# Patient Record
Sex: Female | Born: 1980 | Race: Black or African American | Hispanic: No | Marital: Single | Smoking: Never smoker
Health system: Southern US, Community
[De-identification: ages and names within clinical notes are randomized; demographics above are authoritative.]

## PROBLEM LIST (undated history)

## (undated) DIAGNOSIS — D649 Anemia, unspecified: Secondary | ICD-10-CM

## (undated) DIAGNOSIS — D573 Sickle-cell trait: Secondary | ICD-10-CM

## (undated) HISTORY — PX: NO PAST SURGERIES: SHX2092

## (undated) HISTORY — DX: Anemia, unspecified: D64.9

---

## 2004-04-13 ENCOUNTER — Ambulatory Visit: Payer: Self-pay | Admitting: Family Medicine

## 2004-06-10 ENCOUNTER — Ambulatory Visit: Payer: Self-pay | Admitting: Family Medicine

## 2004-06-10 ENCOUNTER — Ambulatory Visit: Payer: Self-pay | Admitting: *Deleted

## 2004-06-17 ENCOUNTER — Emergency Department (HOSPITAL_COMMUNITY): Admission: EM | Admit: 2004-06-17 | Discharge: 2004-06-18 | Payer: Self-pay | Admitting: Emergency Medicine

## 2004-07-12 ENCOUNTER — Ambulatory Visit: Payer: Self-pay | Admitting: Family Medicine

## 2004-09-14 ENCOUNTER — Ambulatory Visit: Payer: Self-pay | Admitting: Internal Medicine

## 2004-09-15 ENCOUNTER — Inpatient Hospital Stay (HOSPITAL_COMMUNITY): Admission: AD | Admit: 2004-09-15 | Discharge: 2004-09-15 | Payer: Self-pay | Admitting: Obstetrics & Gynecology

## 2004-09-18 ENCOUNTER — Inpatient Hospital Stay (HOSPITAL_COMMUNITY): Admission: AD | Admit: 2004-09-18 | Discharge: 2004-09-18 | Payer: Self-pay | Admitting: Obstetrics and Gynecology

## 2004-09-21 ENCOUNTER — Inpatient Hospital Stay (HOSPITAL_COMMUNITY): Admission: AD | Admit: 2004-09-21 | Discharge: 2004-09-21 | Payer: Self-pay | Admitting: Obstetrics and Gynecology

## 2004-09-28 ENCOUNTER — Inpatient Hospital Stay (HOSPITAL_COMMUNITY): Admission: AD | Admit: 2004-09-28 | Discharge: 2004-09-28 | Payer: Self-pay | Admitting: Obstetrics and Gynecology

## 2004-10-12 ENCOUNTER — Inpatient Hospital Stay (HOSPITAL_COMMUNITY): Admission: AD | Admit: 2004-10-12 | Discharge: 2004-10-12 | Payer: Self-pay | Admitting: Obstetrics and Gynecology

## 2005-08-31 ENCOUNTER — Ambulatory Visit (HOSPITAL_COMMUNITY): Admission: RE | Admit: 2005-08-31 | Discharge: 2005-08-31 | Payer: Self-pay | Admitting: Obstetrics and Gynecology

## 2005-11-02 ENCOUNTER — Ambulatory Visit: Payer: Self-pay | Admitting: Gynecology

## 2005-11-08 ENCOUNTER — Encounter: Admission: RE | Admit: 2005-11-08 | Discharge: 2005-11-08 | Payer: Self-pay | Admitting: Family Medicine

## 2005-11-16 ENCOUNTER — Ambulatory Visit: Payer: Self-pay | Admitting: Family Medicine

## 2005-11-16 ENCOUNTER — Ambulatory Visit (HOSPITAL_COMMUNITY): Admission: RE | Admit: 2005-11-16 | Discharge: 2005-11-16 | Payer: Self-pay | Admitting: Obstetrics and Gynecology

## 2005-11-20 ENCOUNTER — Ambulatory Visit: Payer: Self-pay | Admitting: Family Medicine

## 2005-11-27 ENCOUNTER — Ambulatory Visit: Payer: Self-pay | Admitting: Obstetrics & Gynecology

## 2005-12-11 ENCOUNTER — Ambulatory Visit: Payer: Self-pay | Admitting: Obstetrics & Gynecology

## 2005-12-28 ENCOUNTER — Ambulatory Visit: Payer: Self-pay | Admitting: *Deleted

## 2006-01-03 ENCOUNTER — Ambulatory Visit: Payer: Self-pay | Admitting: Obstetrics and Gynecology

## 2006-01-03 ENCOUNTER — Inpatient Hospital Stay (HOSPITAL_COMMUNITY): Admission: AD | Admit: 2006-01-03 | Discharge: 2006-01-05 | Payer: Self-pay | Admitting: Gynecology

## 2008-07-09 ENCOUNTER — Ambulatory Visit (HOSPITAL_COMMUNITY): Admission: RE | Admit: 2008-07-09 | Discharge: 2008-07-09 | Payer: Self-pay | Admitting: Family Medicine

## 2008-10-20 ENCOUNTER — Ambulatory Visit (HOSPITAL_COMMUNITY): Admission: RE | Admit: 2008-10-20 | Discharge: 2008-10-20 | Payer: Self-pay | Admitting: Family Medicine

## 2008-10-26 ENCOUNTER — Ambulatory Visit: Payer: Self-pay | Admitting: Obstetrics and Gynecology

## 2008-10-26 ENCOUNTER — Inpatient Hospital Stay (HOSPITAL_COMMUNITY): Admission: AD | Admit: 2008-10-26 | Discharge: 2008-10-28 | Payer: Self-pay | Admitting: Family Medicine

## 2010-01-01 IMAGING — US US OB DETAIL+14 WK
1 series · 14 of 28 positions shown · non-contrast
Comparison: none

OBSTETRICAL ULTRASOUND:
 This ultrasound exam was performed in the [HOSPITAL] Ultrasound Department.  The OB US report was generated in the AS system, and faxed to the ordering physician.  This report is also available in [REDACTED] PACS.

[Series 1: us ob detail +14 wk · 0.15mm/px · 121 acquisitions, 14 frames shown]
[im 5/121]
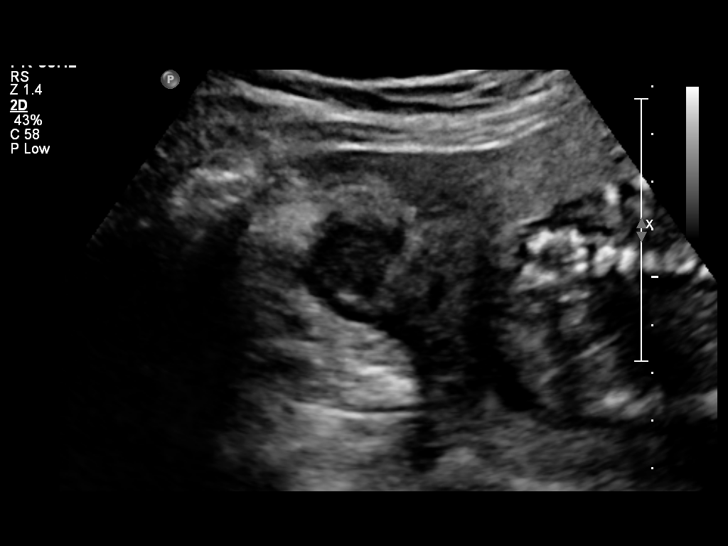
[im 14/121]
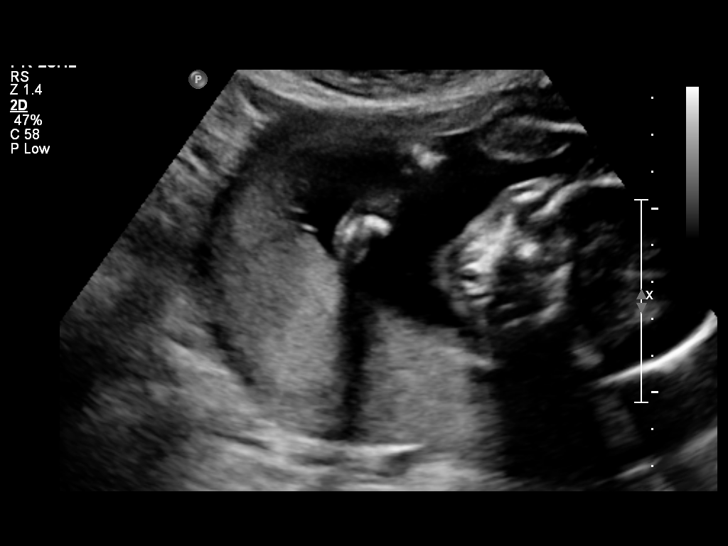
[im 23/121]
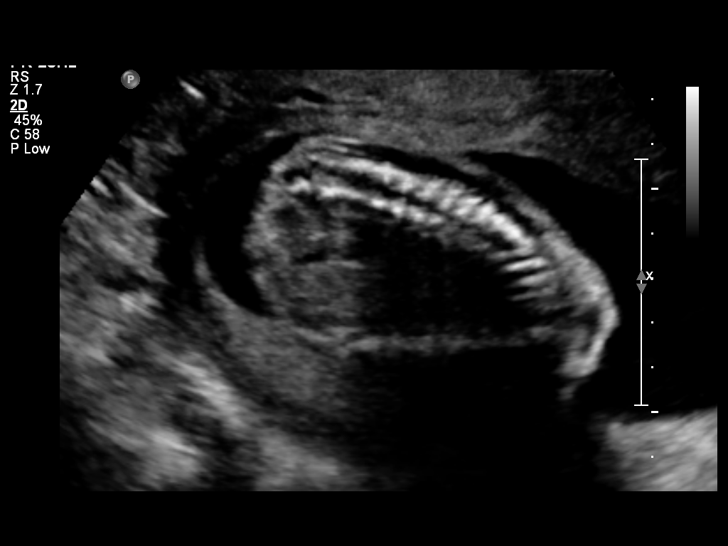
[im 32/121]
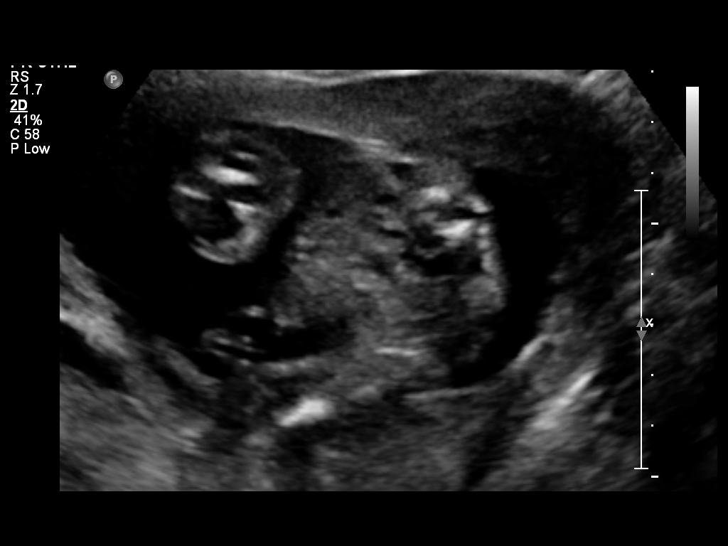
[im 41/121]
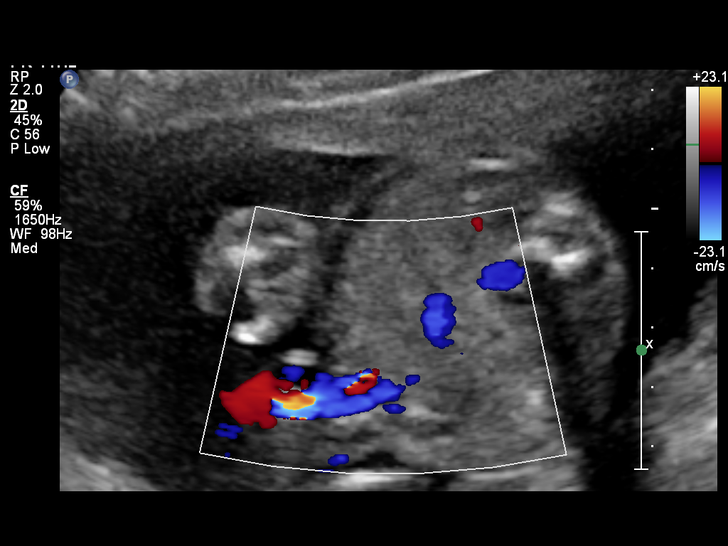
[im 49/121]
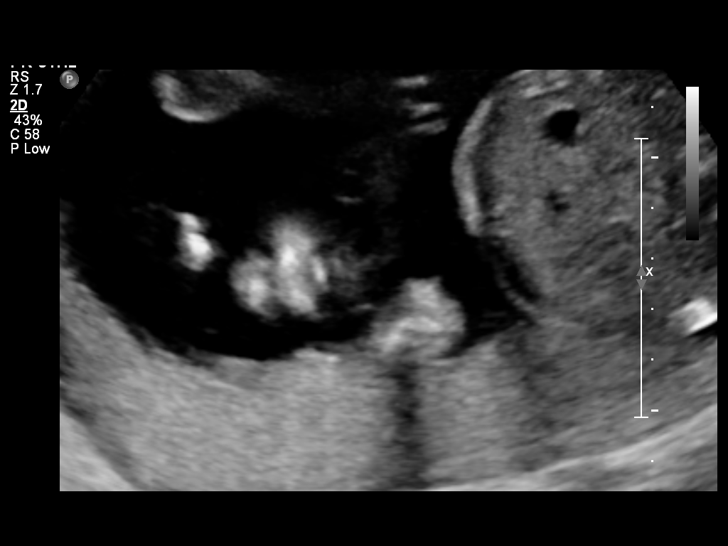
[im 58/121]
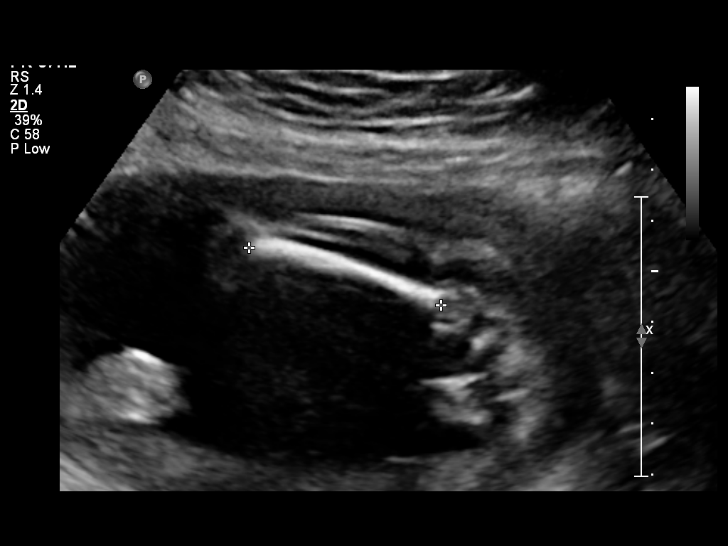
[im 67/121]
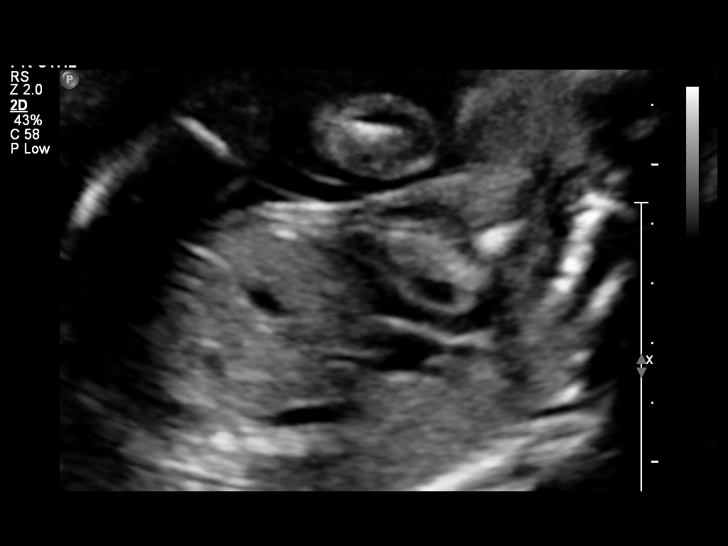
[im 76/121]
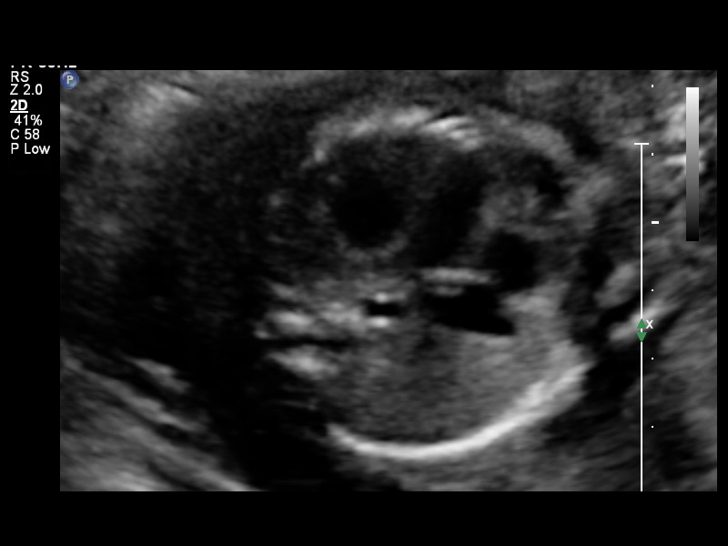
[im 85/121]
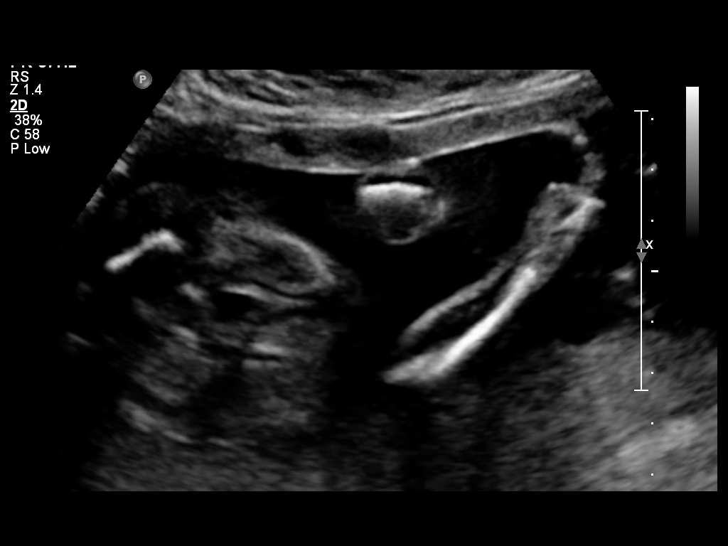
[im 94/121]
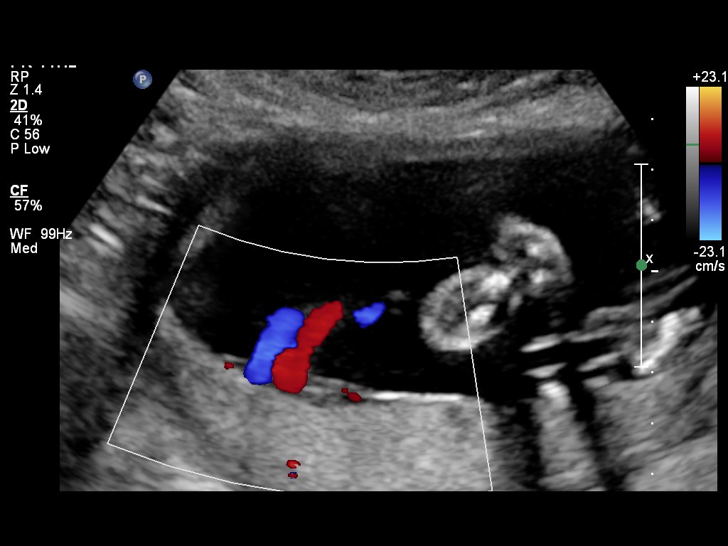
[im 103/121]
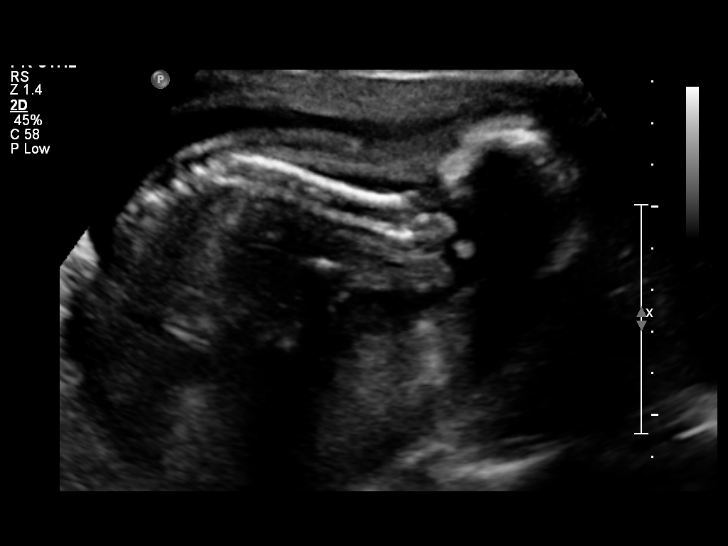
[im 112/121]
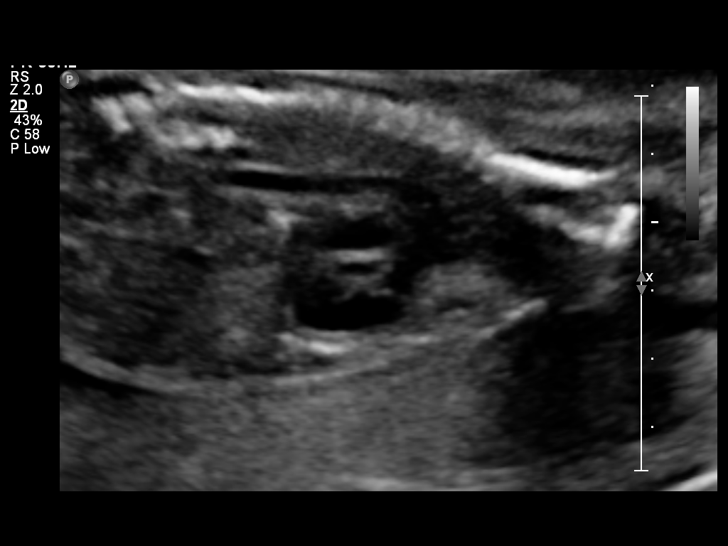
[im 121/121]
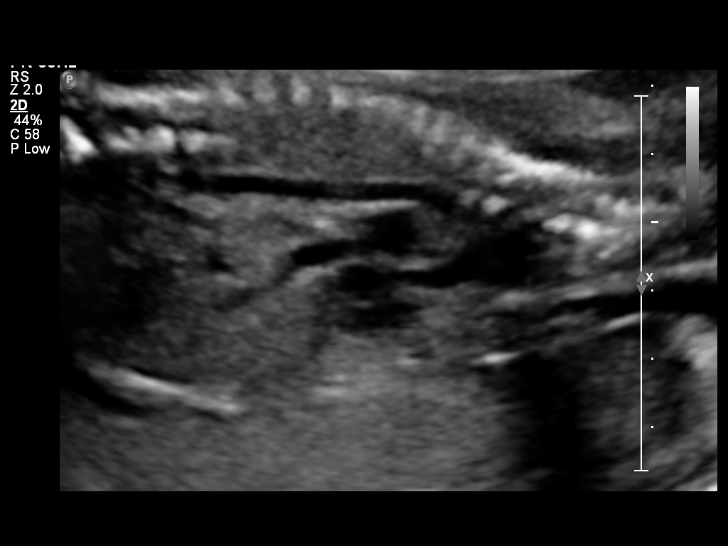

[14 of 28 positions shown; findings below may reference images not displayed]

IMPRESSION: See AS Obstetric US report.

## 2010-05-15 ENCOUNTER — Encounter: Payer: Self-pay | Admitting: *Deleted

## 2010-07-31 LAB — CBC
HCT: 32.5 % — ABNORMAL LOW (ref 36.0–46.0)
MCHC: 33.6 g/dL (ref 30.0–36.0)
MCV: 79.6 fL (ref 78.0–100.0)
Platelets: 218 10*3/uL (ref 150–400)
RBC: 4.57 MIL/uL (ref 3.87–5.11)
RDW: 15.6 % — ABNORMAL HIGH (ref 11.5–15.5)

## 2010-07-31 LAB — RAPID HIV SCREEN (WH-MAU): Rapid HIV Screen: NONREACTIVE

## 2010-07-31 LAB — RPR: RPR Ser Ql: NONREACTIVE

## 2011-01-06 ENCOUNTER — Ambulatory Visit: Payer: Self-pay

## 2011-01-06 ENCOUNTER — Other Ambulatory Visit: Payer: Self-pay | Admitting: Occupational Medicine

## 2011-01-06 DIAGNOSIS — R7611 Nonspecific reaction to tuberculin skin test without active tuberculosis: Secondary | ICD-10-CM

## 2012-08-07 ENCOUNTER — Inpatient Hospital Stay (HOSPITAL_COMMUNITY)
Admission: AD | Admit: 2012-08-07 | Discharge: 2012-08-07 | Disposition: A | Discharge status: Home / Self Care | Payer: Medicaid Other | Source: Ambulatory Visit | Attending: Obstetrics and Gynecology | Admitting: Obstetrics and Gynecology

## 2012-08-07 ENCOUNTER — Inpatient Hospital Stay (HOSPITAL_COMMUNITY): Payer: Medicaid Other

## 2012-08-07 ENCOUNTER — Encounter (HOSPITAL_COMMUNITY): Payer: Self-pay | Admitting: *Deleted

## 2012-08-07 DIAGNOSIS — O209 Hemorrhage in early pregnancy, unspecified: Secondary | ICD-10-CM

## 2012-08-07 DIAGNOSIS — B9689 Other specified bacterial agents as the cause of diseases classified elsewhere: Secondary | ICD-10-CM | POA: Insufficient documentation

## 2012-08-07 DIAGNOSIS — N760 Acute vaginitis: Secondary | ICD-10-CM | POA: Insufficient documentation

## 2012-08-07 DIAGNOSIS — O4690 Antepartum hemorrhage, unspecified, unspecified trimester: Secondary | ICD-10-CM

## 2012-08-07 DIAGNOSIS — A499 Bacterial infection, unspecified: Secondary | ICD-10-CM | POA: Insufficient documentation

## 2012-08-07 DIAGNOSIS — O200 Threatened abortion: Secondary | ICD-10-CM

## 2012-08-07 DIAGNOSIS — O2390 Unspecified genitourinary tract infection in pregnancy, unspecified trimester: Secondary | ICD-10-CM | POA: Insufficient documentation

## 2012-08-07 HISTORY — DX: Sickle-cell trait: D57.3

## 2012-08-07 LAB — CBC WITH DIFFERENTIAL/PLATELET
Basophils Relative: 0 % (ref 0–1)
Eosinophils Relative: 1 % (ref 0–5)
HCT: 36.4 % (ref 36.0–46.0)
Hemoglobin: 12.7 g/dL (ref 12.0–15.0)
Lymphocytes Relative: 32 % (ref 12–46)
Monocytes Relative: 8 % (ref 3–12)
Neutro Abs: 4.3 10*3/uL (ref 1.7–7.7)
Neutrophils Relative %: 59 % (ref 43–77)
RBC: 5.02 MIL/uL (ref 3.87–5.11)
WBC: 7.4 10*3/uL (ref 4.0–10.5)

## 2012-08-07 LAB — ABO/RH: ABO/RH(D): A POS

## 2012-08-07 LAB — URINALYSIS, ROUTINE W REFLEX MICROSCOPIC
Bilirubin Urine: NEGATIVE
Glucose, UA: NEGATIVE mg/dL
Leukocytes, UA: NEGATIVE
Nitrite: NEGATIVE

## 2012-08-07 LAB — WET PREP, GENITAL
Trich, Wet Prep: NONE SEEN
Yeast Wet Prep HPF POC: NONE SEEN

## 2012-08-07 MED ORDER — METRONIDAZOLE 500 MG PO TABS: 500.0000 mg | ORAL_TABLET | Freq: Two times a day (BID) | ORAL | Status: DC

## 2012-08-07 NOTE — MAU Provider Note (Signed)
History     CSN: [id number]  Arrival date and time: 08/07/12 1251   First Provider Initiated Contact with Patient 08/07/12 1351      Chief Complaint  Patient presents with  . Vaginal Bleeding   HPI Carolyn Caldwell is 32 y.o. [id number] [redacted]w[redacted]d weeks presenting with vaginal bleeding that began at14 hrs ago and then at 11 am today saw a red blood clot.  Did not see any last time she urinated.  Last intercourse was at 2am today.  Denies abdominal pain or cramping.  Has appt with clinic for 4/28.  Patient is comfortable at this time.    Past Medical History  Diagnosis Date  . Sickle cell trait     Past Surgical History  Procedure Laterality Date  . No past surgeries      History reviewed. No pertinent family history.  History  Substance Use Topics  . Smoking status: Never Smoker   . Smokeless tobacco: Never Used  . Alcohol Use: No    Allergies: No Known Allergies  Prescriptions prior to admission  Medication Sig Dispense Refill  . Prenatal Vit-Fe Fumarate-FA (PRENATAL MULTIVITAMIN) TABS Take 1 tablet by mouth daily at 12 noon.        Review of Systems  Constitutional: Negative.   Gastrointestinal: Negative for nausea, vomiting and abdominal pain.  Genitourinary:       + for vaginal bleeding  Neurological: Negative for headaches.   Physical Exam   Blood pressure 104/58, pulse 58, temperature 98.2 F (36.8 C), temperature source Oral, resp. rate 16, height 4' 10.5" (1.486 m), weight 151 lb 9.6 oz (68.765 kg), last menstrual period 05/21/2012, SpO2 100.00%.  Physical Exam  Constitutional: She is oriented to person, place, and time. She appears well-developed and well-nourished. No distress.  HENT:  Head: Normocephalic.  Neck: Normal range of motion.  Cardiovascular: Normal rate.   Respiratory: Effort normal.  GI: Soft. She exhibits no distension and no mass. There is no tenderness. There is no rebound and no guarding.  Genitourinary:    There is no rash  or tenderness on the right labia. There is lesion (small sebaecous cyst on left labia minora, no drainage) on the left labia. There is no rash or tenderness on the left labia. Uterus is enlarged (much smaller than expected for dates by LMP). Uterus is not tender. Cervix exhibits no discharge and no friability. Right adnexum displays no mass, no tenderness and no fullness. Left adnexum displays no mass, no tenderness and no fullness. There is bleeding (small amount of bright red blood with several small blood clots) around the vagina. No vaginal discharge found.  Neurological: She is alert and oriented to person, place, and time.  Skin: Skin is warm and dry.  Psychiatric: She has a normal mood and affect. Her behavior is normal.   Results for orders placed during the hospital encounter of 08/07/12 (from the past 24 hour(s))  URINALYSIS, ROUTINE W REFLEX MICROSCOPIC     Status: None   Collection Time    08/07/12  1:15 PM      Result Value Range   Color, Urine YELLOW  YELLOW   APPearance CLEAR  CLEAR   Specific Gravity, Urine 1.010  1.005 - 1.030   pH 7.0  5.0 - 8.0   Glucose, UA NEGATIVE  NEGATIVE mg/dL   Hgb urine dipstick NEGATIVE  NEGATIVE   Bilirubin Urine NEGATIVE  NEGATIVE   Ketones, ur NEGATIVE  NEGATIVE mg/dL   Protein, ur NEGATIVE  NEGATIVE  mg/dL   Urobilinogen, UA 0.2  0.0 - 1.0 mg/dL   Nitrite NEGATIVE  NEGATIVE   Leukocytes, UA NEGATIVE  NEGATIVE  POCT PREGNANCY, URINE     Status: Abnormal   Collection Time    08/07/12  1:23 PM      Result Value Range   Preg Test, Ur POSITIVE (*) NEGATIVE  WET PREP, GENITAL     Status: Abnormal   Collection Time    08/07/12  2:05 PM      Result Value Range   Yeast Wet Prep HPF POC NONE SEEN  NONE SEEN   Trich, Wet Prep NONE SEEN  NONE SEEN   Clue Cells Wet Prep HPF POC MODERATE (*) NONE SEEN   WBC, Wet Prep HPF POC FEW (*) NONE SEEN  ABO/RH     Status: None   Collection Time    08/07/12  3:40 PM      Result Value Range   ABO/RH(D) A  POS    HCG, QUANTITATIVE, PREGNANCY     Status: Abnormal   Collection Time    08/07/12  3:40 PM      Result Value Range   hCG, Beta Chain, Quant, S 9830 (*) <5 mIU/mL  CBC WITH DIFFERENTIAL     Status: Abnormal (Preliminary result)   Collection Time    08/07/12  3:40 PM      Result Value Range   WBC PENDING  4.0 - 10.5 K/uL   RBC 5.02  3.87 - 5.11 MIL/uL   Hemoglobin 12.7  12.0 - 15.0 g/dL   HCT 16.1  09.6 - 04.5 %   MCV 72.5 (*) 78.0 - 100.0 fL   MCH 25.3 (*) 26.0 - 34.0 pg   MCHC 34.9  30.0 - 36.0 g/dL   RDW 40.9  81.1 - 91.4 %   Platelets 196  150 - 400 K/uL   Neutrophils Relative PENDING  43 - 77 %   Neutro Abs PENDING  1.7 - 7.7 K/uL   Band Neutrophils PENDING  0 - 10 %   Lymphocytes Relative PENDING  12 - 46 %   Lymphs Abs PENDING  0.7 - 4.0 K/uL   Monocytes Relative PENDING  3 - 12 %   Monocytes Absolute PENDING  0.1 - 1.0 K/uL   Eosinophils Relative PENDING  0 - 5 %   Eosinophils Absolute PENDING  0.0 - 0.7 K/uL   Basophils Relative PENDING  0 - 1 %   Basophils Absolute PENDING  0.0 - 0.1 K/uL   WBC Morphology PENDING     RBC Morphology PENDING     Smear Review PENDING     nRBC PENDING  0 /100 WBC   Metamyelocytes Relative PENDING     Myelocytes PENDING     Promyelocytes Absolute PENDING     Blasts PENDING       MAU Course  Procedures   GC/CHL culture to lab  MDM After U/S results back- ordered BHCG and ABORH  Assessment and Plan  A:  Bleeding in first trimester of pregnancy at [redacted]w[redacted]d gestation by ultrasound--less than GA of [redacted]w[redacted]d  by LMP 05/21/12      Threatened miscarriage      Bacterial vaginosis            P: Return to MAU in 48 hrs for repeat BHCG     Discussed labs and ultrasound report with the patient     Return for heavy bleeding or increased pain     Rx  for Flagyl to pharmacy    Shalynn Jorstad,EVE M 08/07/2012, 4:23 PM

## 2012-08-07 NOTE — MAU Note (Signed)
Patient states she had some slight spotting yesterday. This am has very small red blood clots when wiping with tissue. Denies any pain. States she has had a positive pregnancy test at [facility].

## 2012-08-08 NOTE — MAU Provider Note (Signed)
Attestation of Attending Supervision of Advanced Practitioner (CNM/NP): Evaluation and management procedures were performed by the Advanced Practitioner under my supervision and collaboration.  I have reviewed the Advanced Practitioner's note and chart, and I agree with the management and plan.  Andriana Casa 08/08/2012 12:52 PM

## 2012-08-09 ENCOUNTER — Inpatient Hospital Stay (HOSPITAL_COMMUNITY)
Admission: AD | Admit: 2012-08-09 | Discharge: 2012-08-09 | Disposition: A | Discharge status: Home / Self Care | Payer: Medicaid Other | Source: Ambulatory Visit | Attending: Family Medicine | Admitting: Family Medicine

## 2012-08-09 ENCOUNTER — Encounter (HOSPITAL_COMMUNITY): Payer: Self-pay | Admitting: *Deleted

## 2012-08-09 DIAGNOSIS — O021 Missed abortion: Secondary | ICD-10-CM | POA: Insufficient documentation

## 2012-08-09 LAB — CBC
HCT: 35.5 % — ABNORMAL LOW (ref 36.0–46.0)
MCV: 73.7 fL — ABNORMAL LOW (ref 78.0–100.0)
RBC: 4.82 MIL/uL (ref 3.87–5.11)
WBC: 7.1 10*3/uL (ref 4.0–10.5)

## 2012-08-09 LAB — HCG, QUANTITATIVE, PREGNANCY: hCG, Beta Chain, Quant, S: 8111 m[IU]/mL — ABNORMAL HIGH (ref <5)

## 2012-08-09 MED ORDER — OXYCODONE-ACETAMINOPHEN 5-325 MG PO TABS: 1.0000 | ORAL_TABLET | Freq: Four times a day (QID) | ORAL | Status: DC | PRN

## 2012-08-09 MED ORDER — PROMETHAZINE HCL 12.5 MG PO TABS: 12.5000 mg | ORAL_TABLET | Freq: Four times a day (QID) | ORAL | Status: DC | PRN

## 2012-08-09 MED ORDER — MISOPROSTOL 200 MCG PO TABS: ORAL_TABLET | ORAL | Status: DC

## 2012-08-09 MED ORDER — IBUPROFEN 600 MG PO TABS: 600.0000 mg | ORAL_TABLET | Freq: Four times a day (QID) | ORAL | Status: DC | PRN

## 2012-08-09 NOTE — MAU Note (Signed)
Patient to MAU for repeat BHCG. Patient denies pain but does have a small spot of blood only with wiping.

## 2012-08-09 NOTE — MAU Provider Note (Signed)
History     CSN: [id number]  Arrival date and time: 08/09/12 1508   None     Chief Complaint  Patient presents with  . Follow-up   HPI  Presents for repeat BHCG.  She was seen 4/16 with vaginal bleeding in pregnancy.  By LMP she would have been [redacted]w[redacted]d.   BHCG 9830, A+ blood type and Ultrasound that showed an IUGS that was irregular in shape with a probable YS.  Spotting today. Denies pain.   Past Medical History  Diagnosis Date  . Sickle cell trait     Past Surgical History  Procedure Laterality Date  . No past surgeries      History reviewed. No pertinent family history.  History  Substance Use Topics  . Smoking status: Never Smoker   . Smokeless tobacco: Never Used  . Alcohol Use: No    Allergies: No Known Allergies  Prescriptions prior to admission  Medication Sig Dispense Refill  . metroNIDAZOLE (FLAGYL) 500 MG tablet Take 500 mg by mouth 2 (two) times daily. X 7 days. Started on 08/07/2012.      Marland Kitchen Prenatal Vit-Fe Fumarate-FA (PRENATAL MULTIVITAMIN) TABS Take 1 tablet by mouth daily at 12 noon.      . [DISCONTINUED] metroNIDAZOLE (FLAGYL) 500 MG tablet Take 1 tablet (500 mg total) by mouth 2 (two) times daily.  14 tablet  0    Review of Systems  Constitutional: Negative for fever and chills.  Gastrointestinal: Negative for abdominal pain.  Genitourinary:       Vaginal spotting   Physical Exam    Physical Exam  Constitutional: She is oriented to person, place, and time. She appears well-developed and well-nourished. No distress.  HENT:  Head: Normocephalic.  Neck: Normal range of motion.  Cardiovascular: Normal rate.   Respiratory: Effort normal.  GI: Soft. She exhibits no distension and no mass. There is no tenderness. There is no rebound and no guarding.  Genitourinary:  Not indicated  Neurological: She is alert and oriented to person, place, and time.  Skin: Skin is warm and dry.  Psychiatric: She has a normal mood and affect. Her behavior is  normal.    Results for orders placed during the hospital encounter of 08/09/12 (from the past 24 hour(s))  HCG, QUANTITATIVE, PREGNANCY     Status: Abnormal   Collection Time    08/09/12  3:31 PM      Result Value Range   hCG, Beta Chain, Quant, S 8111 (*) <5 mIU/mL  CBC     Status: Abnormal   Collection Time    08/09/12  4:33 PM      Result Value Range   WBC 7.1  4.0 - 10.5 K/uL   RBC 4.82  3.87 - 5.11 MIL/uL   Hemoglobin 12.0  12.0 - 15.0 g/dL   HCT 40.9 (*) 81.1 - 91.4 %   MCV 73.7 (*) 78.0 - 100.0 fL   MCH 24.9 (*) 26.0 - 34.0 pg   MCHC 33.8  30.0 - 36.0 g/dL   RDW 78.2  95.6 - 21.3 %   Platelets 261  150 - 400 K/uL    MAU Course  Procedures  MDM Discussed recent visit/labs and today's labs with Dr. Shawnie Pons.  Offer expected management vs cytotec.  I discussed labs with patient and her husband and offered choices.  Gave them time to ask questions and stepped out of the room to give them time to discuss.  She called me back and wants to treat  with Cytotec.          Early Intrauterine Pregnancy Failure  _x_  Documented intrauterine pregnancy failure less than or equal to [redacted] weeks gestation  __x_  No serious current illness  __x_  Baseline Hgb greater than or equal to 10g/dl  _x__  Patient has easily accessible transportation to the hospital  _x_  Clear preference  _x_  Practitioner/physician deems patient reliable  _x_  Counseling by practitioner or physician  __x_  Patient education by RN  _n/a__  Consent form signed  na  Rho-Gam given by RN if indicated  ___ Medication dispensed   __x   Cytotec 800 mcg  _x   Intravaginally by patient at home         __   Intravaginally by RN in MAU        __   Rectally by patient at home        __   Rectally by RN in MAU  _x Ibuprofen 600 mg 1 tablet by mouth every 6 hours as needed #30  _x_  Hydrocodone/acetaminophen 5/325 mg by mouth every 4 to 6 hours as needed  _x_  Phenergan 12.5 mg by mouth every 4 hours as  needed for nausea    AT time of discharge the electronic Rx were not going through.  I printed the Rxs. Assessment and Plan  A:  Failed pregnancyat [redacted]w[redacted]d gestation with falling BHCG   P:  Patient's choice to treat with Cytotec-discussed use of medication and expected reaction     Instructed patient on miscarriage sxs and medication given     Return for heavy vaginal bleeding or severe abdominal pain     Referral made to GYN Clinic in 1 week for follow up    Note given for work for yesterday Matt Holmes 08/09/2012, 5:31 PM

## 2012-08-09 NOTE — MAU Provider Note (Signed)
Chart reviewed and agree with management and plan.  

## 2012-08-20 ENCOUNTER — Other Ambulatory Visit: Payer: Self-pay

## 2012-08-26 ENCOUNTER — Other Ambulatory Visit: Payer: Self-pay

## 2012-08-26 DIAGNOSIS — O039 Complete or unspecified spontaneous abortion without complication: Secondary | ICD-10-CM

## 2013-06-12 ENCOUNTER — Encounter (HOSPITAL_COMMUNITY): Payer: Self-pay | Admitting: *Deleted

## 2014-02-23 ENCOUNTER — Encounter (HOSPITAL_COMMUNITY): Payer: Self-pay | Admitting: *Deleted

## 2014-04-24 NOTE — L&D Delivery Note (Signed)
Patient is 34 y.o. [id number] [redacted]w[redacted]d admitted for IOL for oligohydramnios 2/2 fetal bladder outlet obstruction, hx of diet-controlled A2/B diabetes.    Delivery Note At 3:13 PM a viable female was delivered via SVD (Presentation: direct occiput anterior).  APGAR: 7, pending (baby in NICU); weight pending.   Placenta status: Intact, Spontaneous.  Cord: 3 vessels with the following complications: nuchal cord, delivered through.   Anesthesia:  None Episiotomy:  None Lacerations:  None Est. Blood Loss (mL):  150   Mom to postpartum.  Baby to NICU.  Carolyn Caldwell 12/17/2014, 3:33 PM   Upon arrival patient was complete and pushing. She pushed with good maternal effort to deliver a healthy baby female. Baby delivered without difficulty, was noted to have good tone and place on maternal abdomen for oral suctioning, drying and stimulation. Delayed cord clamping performed. Placenta delivered intact with 3V cord. Vaginal canal and perineum was inspected; no lacerations. Pitocin was started and uterus massaged until bleeding slowed. Counts of sharps, instruments, and lap pads were all correct.   Carolyn Abernethy, MD PGY-1 [name]

## 2014-07-16 ENCOUNTER — Other Ambulatory Visit (HOSPITAL_COMMUNITY): Payer: Self-pay | Admitting: Urology

## 2014-07-16 DIAGNOSIS — Z3682 Encounter for antenatal screening for nuchal translucency: Secondary | ICD-10-CM

## 2014-07-16 LAB — OB RESULTS CONSOLE ABO/RH: RH Type: POSITIVE

## 2014-07-16 LAB — OB RESULTS CONSOLE HGB/HCT, BLOOD
HEMATOCRIT: 36 %
HEMOGLOBIN: 11.3 g/dL

## 2014-07-16 LAB — OB RESULTS CONSOLE GC/CHLAMYDIA
CHLAMYDIA, DNA PROBE: NEGATIVE
Gonorrhea: NEGATIVE

## 2014-07-16 LAB — OB RESULTS CONSOLE HIV ANTIBODY (ROUTINE TESTING): HIV: NONREACTIVE

## 2014-07-16 LAB — GLUCOSE TOLERANCE, 1 HOUR (50G) W/O FASTING: GLUCOSE 1 HOUR GTT: 138 mg/dL (ref ?–200)

## 2014-07-16 LAB — OB RESULTS CONSOLE ANTIBODY SCREEN: ANTIBODY SCREEN: NEGATIVE

## 2014-07-16 LAB — CYTOLOGY - PAP
CYTOLOGY - PAP: NEGATIVE
Pap: NEGATIVE

## 2014-07-16 LAB — OB RESULTS CONSOLE PLATELET COUNT: PLATELETS: 316 10*3/uL

## 2014-07-16 LAB — OB RESULTS CONSOLE RPR: RPR: NONREACTIVE

## 2014-07-16 LAB — OB RESULTS CONSOLE VARICELLA ZOSTER ANTIBODY, IGG: Varicella: IMMUNE

## 2014-07-17 ENCOUNTER — Ambulatory Visit (HOSPITAL_COMMUNITY): Admission: RE | Admit: 2014-07-17 | Payer: Medicaid Other | Source: Ambulatory Visit

## 2014-07-17 ENCOUNTER — Encounter (HOSPITAL_COMMUNITY): Payer: Self-pay

## 2014-07-17 ENCOUNTER — Ambulatory Visit (HOSPITAL_COMMUNITY)
Admission: RE | Admit: 2014-07-17 | Discharge: 2014-07-17 | Disposition: A | Discharge status: Home / Self Care | Payer: Medicaid Other | Source: Ambulatory Visit | Attending: Physician Assistant | Admitting: Physician Assistant

## 2014-07-17 DIAGNOSIS — Z3682 Encounter for antenatal screening for nuchal translucency: Secondary | ICD-10-CM | POA: Insufficient documentation

## 2014-07-17 DIAGNOSIS — Z3A12 12 weeks gestation of pregnancy: Secondary | ICD-10-CM | POA: Insufficient documentation

## 2014-07-17 DIAGNOSIS — Z36 Encounter for antenatal screening of mother: Secondary | ICD-10-CM | POA: Diagnosis not present

## 2014-07-20 LAB — OB RESULTS CONSOLE HEPATITIS B SURFACE ANTIGEN: HEP B S AG: NEGATIVE

## 2014-07-20 LAB — GLUCOSE TOLERANCE, 3 HOURS
GLUCOSE 1 HOUR GTT: 161 mg/dL (ref ?–200)
GLUCOSE 2 HOUR GTT: 160 mg/dL — AB (ref ?–140)
Glucose, GTT - 3 Hour: 121 mg/dL (ref ?–140)
Glucose, GTT - Fasting: 105 mg/dL (ref 80–110)

## 2014-07-20 LAB — OB RESULTS CONSOLE RUBELLA ANTIBODY, IGM: RUBELLA: IMMUNE

## 2014-07-27 ENCOUNTER — Ambulatory Visit: Payer: Medicaid Other | Admitting: *Deleted

## 2014-07-27 ENCOUNTER — Encounter: Payer: Medicaid Other | Attending: Obstetrics & Gynecology | Admitting: *Deleted

## 2014-07-27 VITALS — Ht 58.38 in | Wt 156.0 lb

## 2014-07-27 DIAGNOSIS — O24419 Gestational diabetes mellitus in pregnancy, unspecified control: Secondary | ICD-10-CM

## 2014-07-27 DIAGNOSIS — Z713 Dietary counseling and surveillance: Secondary | ICD-10-CM | POA: Diagnosis not present

## 2014-07-27 MED ORDER — GLUCOSE BLOOD VI STRP: ORAL_STRIP | Status: DC

## 2014-07-27 MED ORDER — ACCU-CHEK FASTCLIX LANCETS MISC: 1.0000 | Freq: Four times a day (QID) | Status: AC

## 2014-07-27 NOTE — Progress Notes (Signed)
  Patient was seen on 07/27/14 for Gestational Diabetes self-management . The following learning objectives were met by the patient :   States the definition of Gestational Diabetes  States when to check blood glucose levels  Demonstrates proper blood glucose monitoring techniques  States the effect of stress and exercise on blood glucose levels  States the importance of limiting caffeine and abstaining from alcohol and smoking  Plan:  Consider  increasing your activity level by walking daily as tolerated Begin checking BG before breakfast and 2 hours after first bit of breakfast, lunch and dinner after  as directed by MD  Take medication  as directed by MD  Blood glucose monitor given: Accu-Chek Lot # [id number] Exp: 04/24/15 Blood glucose reading: 91 pp  Patient instructed to monitor glucose levels: FBS: 60 - <90 2 hour: <120  Patient received the following handouts:  Nutrition Diabetes and Pregnancy  Carbohydrate Counting List  Meal Planning worksheet  Patient will be seen for follow-up as needed.

## 2014-07-27 NOTE — Progress Notes (Signed)
Nutrition note: GDM diet education Pt has h/o obesity & is a newly diagnosed GDM pt. Pt had GDM with 1st pregnancy. Pt has lost 3# @ 1857w4d. Pt reports she had a lot of N&V in the beginning but it has decreased. Pt reports eating small amounts 2-3x/d due to not being very hungry. Pt is taking a PNV. Pt reports no more N&V or heartburn. Pt received written & verbal education on GDM diet. Discussed wt gain goals of 11-20# or 0.5#/wk. Pt agrees to follow GDM diet with 3 meals & 1-3 snacks/d with proper CHO/ protein combination. Pt has WIC & plans to BF. F/u in 2-4 wks Blondell RevealLaura Serafina Topham, MS, RD, LDN, [facility]

## 2014-07-29 ENCOUNTER — Encounter: Payer: Self-pay | Admitting: *Deleted

## 2014-08-03 ENCOUNTER — Encounter: Payer: Self-pay | Admitting: Obstetrics and Gynecology

## 2014-08-03 ENCOUNTER — Ambulatory Visit (INDEPENDENT_AMBULATORY_CARE_PROVIDER_SITE_OTHER): Payer: Medicaid Other | Admitting: Obstetrics and Gynecology

## 2014-08-03 VITALS — BP 109/52 | HR 69 | Temp 98.4°F | Wt 155.3 lb

## 2014-08-03 DIAGNOSIS — O0991 Supervision of high risk pregnancy, unspecified, first trimester: Secondary | ICD-10-CM

## 2014-08-03 LAB — POCT URINALYSIS DIP (DEVICE)
BILIRUBIN URINE: NEGATIVE
Glucose, UA: NEGATIVE mg/dL
Hgb urine dipstick: NEGATIVE
KETONES UR: NEGATIVE mg/dL
Leukocytes, UA: NEGATIVE
Nitrite: NEGATIVE
PROTEIN: NEGATIVE mg/dL
SPECIFIC GRAVITY, URINE: 1.015 (ref 1.005–1.030)
Urobilinogen, UA: 0.2 mg/dL (ref 0.0–1.0)
pH: 6 (ref 5.0–8.0)

## 2014-08-03 NOTE — Progress Notes (Signed)
Nutrition note: GDM diet f/u Pt has been checking her BS- fasting: 82-100; 2 hr pp: 78-175 (only 3 elevated). Pt reports that she noticed that when she had white rice with her meals that her BS were high after that meal but if she ate brown rice they were wnl.  Pt also noted that her fasting BS were elevated during the week but not on the weekends; pt stated she thinks it's because of stress in the morning getting her kids off to school that could be causing those to be elevated (she reports checking it after they've left for school). Encouraged pt to try eating just brown rice to see if the trend continues to be wnl after those meals. Reviewed portion sizes. Also encouraged pt to check BS before getting out of bed in the morning to see if that changes her fasting BS during the week. Also suggested pt to think about what she's eating before going to bed as well. Pt agreeable. F/u in 4-6 wks Blondell RevealLaura Kylar Leonhardt, MS, RD, LDN, [facility]

## 2014-08-03 NOTE — Progress Notes (Signed)
Nutrition note: GDM diet f/u Pt has been checking her BS- fasting: 82-100; 2 hr pp: 78-175 (only 3 elevated). Pt reports that she noticed that when she had white rice with her meals that her BS were high after that meal but if she ate brown rice they were wnl.  Pt also noted that her fasting BS were elevated during the week but not on the weekends; pt stated she thinks it's because of stress in the morning getting her kids off to school that could be causing those to be elevated (she reports checking it after they've left for school). Encouraged pt to try eating just brown rice to see if the trend continues to be wnl after those meals. Reviewed portion sizes. Also encouraged pt to check BS before getting out of bed in the morning to see if that changes her fasting BS during the week. Also suggested pt to think about what she's eating before going to bed as well. Pt agreeable. F/u in 4-6 wks Janeece Blok, MS, RD, LDN, IBCLC 

## 2014-08-03 NOTE — Progress Notes (Signed)
[facility] work.  Declines flu vaccine.

## 2014-08-03 NOTE — Progress Notes (Signed)
34 y.o. [id number] at 5046w4d here for intial OB visit. Doing well today.   PMHx: - Hemoglobin AS - Anemia  PSHx: - None  OB Hx: - G1: 2007 SAB/D+E 8wks - G2: 2007 NSVD 37 wks, female 6lb9oz, GDM - G3: 2010 NSVD 39 wks, female 7lb9oz - G4: 2014 SAB/D+E 8wks - G5: current  Meds:  - PNV  Allergies: - NKDA  Review of systems negative except as listed above.   Objective: LMP 04/23/2014 Gen: well appearing, no distress HEENT: MMM, no thyromegaly CV: normal rate Lungs: normal effort Abd: gravid, nontender GU: normal external genitalia, no discharge or lesions Ext: no edema  Plan:  1. Transfer from [facility]. Records reviewed.  2. Obesity. Prepregnancy BMI 42. Counseled on weight gain.  3. GDM. Blood sugar log reviewed and mostly at goal. 50% of fasting blood glucose above goal (highest 97, mostly 91-93). Only 3 post prandial blood sugars above goal. Would like to work on diet/exercise prior to starting medications. Will meet with nutrition today and if blood glucose still elevated at next visit will start medications. Patient expresses understanding.  4. Routine PNC. VB precautions reviewed.

## 2014-08-10 ENCOUNTER — Encounter: Payer: Self-pay | Admitting: Obstetrics and Gynecology

## 2014-08-10 ENCOUNTER — Telehealth: Payer: Self-pay

## 2014-08-10 DIAGNOSIS — Z8632 Personal history of gestational diabetes: Secondary | ICD-10-CM

## 2014-08-10 DIAGNOSIS — O09299 Supervision of pregnancy with other poor reproductive or obstetric history, unspecified trimester: Secondary | ICD-10-CM | POA: Insufficient documentation

## 2014-08-10 DIAGNOSIS — O24419 Gestational diabetes mellitus in pregnancy, unspecified control: Secondary | ICD-10-CM | POA: Insufficient documentation

## 2014-08-10 DIAGNOSIS — O99210 Obesity complicating pregnancy, unspecified trimester: Secondary | ICD-10-CM | POA: Insufficient documentation

## 2014-08-10 DIAGNOSIS — O0990 Supervision of high risk pregnancy, unspecified, unspecified trimester: Secondary | ICD-10-CM | POA: Insufficient documentation

## 2014-08-10 NOTE — Telephone Encounter (Signed)
Called patient and discussed 24hour urine. Patient states she can come by Thursday 08/13/14 to pick up jug. No further questions or concerns at this time.

## 2014-08-10 NOTE — Telephone Encounter (Signed)
-----   Message from Catalina AntiguaPeggy Constant, MD sent at 08/10/2014 12:08 PM EDT ----- Can we try to contact patient to have her come in and get 24 hour urine jug. She can bring it in at her 4/25 appointment. Otherwise it will get done at her next appointment  Thanks  Peggy

## 2014-08-17 ENCOUNTER — Ambulatory Visit (INDEPENDENT_AMBULATORY_CARE_PROVIDER_SITE_OTHER): Payer: Medicaid Other | Admitting: Family Medicine

## 2014-08-17 VITALS — BP 109/58 | HR 69 | Temp 98.4°F | Wt 155.6 lb

## 2014-08-17 DIAGNOSIS — O24419 Gestational diabetes mellitus in pregnancy, unspecified control: Secondary | ICD-10-CM

## 2014-08-17 LAB — POCT URINALYSIS DIP (DEVICE)
BILIRUBIN URINE: NEGATIVE
GLUCOSE, UA: 100 mg/dL — AB
HGB URINE DIPSTICK: NEGATIVE
LEUKOCYTES UA: NEGATIVE
NITRITE: NEGATIVE
PH: 5.5 (ref 5.0–8.0)
Protein, ur: NEGATIVE mg/dL
Specific Gravity, Urine: 1.02 (ref 1.005–1.030)
UROBILINOGEN UA: 0.2 mg/dL (ref 0.0–1.0)

## 2014-08-17 LAB — COMPREHENSIVE METABOLIC PANEL
ALBUMIN: 3.6 g/dL (ref 3.5–5.2)
ALT: 9 U/L (ref 0–35)
AST: 12 U/L (ref 0–37)
Alkaline Phosphatase: 31 U/L — ABNORMAL LOW (ref 39–117)
BILIRUBIN TOTAL: 0.4 mg/dL (ref 0.2–1.2)
BUN: 9 mg/dL (ref 6–23)
CO2: 25 mEq/L (ref 19–32)
Calcium: 9.4 mg/dL (ref 8.4–10.5)
Chloride: 102 mEq/L (ref 96–112)
Creat: 0.62 mg/dL (ref 0.50–1.10)
Glucose, Bld: 94 mg/dL (ref 70–99)
POTASSIUM: 4.4 meq/L (ref 3.5–5.3)
Sodium: 135 mEq/L (ref 135–145)
Total Protein: 6.8 g/dL (ref 6.0–8.3)

## 2014-08-17 LAB — HEMOGLOBIN A1C
HEMOGLOBIN A1C: 5.6 % (ref <5.7)
Mean Plasma Glucose: 114 mg/dL (ref <117)

## 2014-08-17 LAB — TSH: TSH: 1.245 u[IU]/mL (ref 0.350–4.500)

## 2014-08-17 LAB — T4, FREE: Free T4: 0.91 ng/dL (ref 0.80–1.80)

## 2014-08-17 MED ORDER — ASPIRIN EC 81 MG PO TBEC: 81.0000 mg | DELAYED_RELEASE_TABLET | Freq: Every day | ORAL | Status: DC

## 2014-08-17 NOTE — Progress Notes (Signed)
Patient is 34 y.o. [id number] 438w4d.  denies LOF, VB, contractions, vaginal discharge.  Overall feeling well. - constipation: hard stools and infrequent => miralax, encouraged hydration - HA => likely dehydration, increase hydration and tylenol prn - A1DM: well controlled  Fasting 88 80 82 81 84 88 86 87 79 84  89 89 90  2h PP bfast: 107 91 84 95 104 93 78 86 80 88  74 78 76 90  2h PP lunch 99 107 85 90 98 98 82 89 89 93  98 103 129  2h PP diner 72 105 94 108 99 102 101

## 2014-08-17 NOTE — Progress Notes (Signed)
C/o headaches most days=6, hasn't used anything for headache yet, will try tylenol. C/o constipation.

## 2014-08-17 NOTE — Progress Notes (Signed)
Trace ketones in urine

## 2014-08-18 LAB — PROTEIN, URINE, 24 HOUR
Protein, 24H Urine: 126 mg/d (ref <150)
Protein, Urine: 9 mg/dL (ref 5–24)

## 2014-09-05 ENCOUNTER — Emergency Department (HOSPITAL_COMMUNITY)
Admission: EM | Admit: 2014-09-05 | Discharge: 2014-09-05 | Disposition: A | Discharge status: Home / Self Care | Payer: Medicaid Other | Attending: Emergency Medicine | Admitting: Emergency Medicine

## 2014-09-05 ENCOUNTER — Encounter (HOSPITAL_COMMUNITY): Payer: Self-pay | Admitting: *Deleted

## 2014-09-05 DIAGNOSIS — K0381 Cracked tooth: Secondary | ICD-10-CM | POA: Diagnosis not present

## 2014-09-05 DIAGNOSIS — Z862 Personal history of diseases of the blood and blood-forming organs and certain disorders involving the immune mechanism: Secondary | ICD-10-CM | POA: Diagnosis not present

## 2014-09-05 DIAGNOSIS — Z3A20 20 weeks gestation of pregnancy: Secondary | ICD-10-CM | POA: Diagnosis not present

## 2014-09-05 DIAGNOSIS — O99612 Diseases of the digestive system complicating pregnancy, second trimester: Secondary | ICD-10-CM | POA: Diagnosis not present

## 2014-09-05 DIAGNOSIS — K088 Other specified disorders of teeth and supporting structures: Secondary | ICD-10-CM | POA: Diagnosis not present

## 2014-09-05 DIAGNOSIS — Z7982 Long term (current) use of aspirin: Secondary | ICD-10-CM | POA: Insufficient documentation

## 2014-09-05 DIAGNOSIS — K0889 Other specified disorders of teeth and supporting structures: Secondary | ICD-10-CM

## 2014-09-05 MED ORDER — AMOXICILLIN 500 MG PO CAPS
500.0000 mg | ORAL_CAPSULE | Freq: Once | ORAL | Status: AC
  Administered 2014-09-05: 500 mg via ORAL
  Filled 2014-09-05: qty 1

## 2014-09-05 MED ORDER — ACETAMINOPHEN 500 MG PO TABS: 500.0000 mg | ORAL_TABLET | Freq: Four times a day (QID) | ORAL | Status: DC | PRN

## 2014-09-05 MED ORDER — BUPIVACAINE-EPINEPHRINE (PF) 0.5% -1:200000 IJ SOLN
1.8000 mL | Freq: Once | INTRAMUSCULAR | Status: AC
  Administered 2014-09-05: 1.8 mL
  Filled 2014-09-05: qty 1.8

## 2014-09-05 MED ORDER — AMOXICILLIN 500 MG PO CAPS: 500.0000 mg | ORAL_CAPSULE | Freq: Three times a day (TID) | ORAL | Status: DC

## 2014-09-05 NOTE — Discharge Instructions (Signed)
Your dental block will only last for a few hours. For additional pain control, take Tylenol as prescribed. Take amoxicillin as prescribed to prevent infection. Follow-up with a dentist as soon as you are able. Return to an emergency department as needed if symptoms worsen.  Dental Pain A tooth ache may be caused by cavities (tooth decay). Cavities expose the nerve of the tooth to air and hot or cold temperatures. It may come from an infection or abscess (also called a boil or furuncle) around your tooth. It is also often caused by dental caries (tooth decay). This causes the pain you are having. DIAGNOSIS  Your caregiver can diagnose this problem by exam. TREATMENT   If caused by an infection, it may be treated with medications which kill germs (antibiotics) and pain medications as prescribed by your caregiver. Take medications as directed.  Only take over-the-counter or prescription medicines for pain, discomfort, or fever as directed by your caregiver.  Whether the tooth ache today is caused by infection or dental disease, you should see your dentist as soon as possible for further care. SEEK MEDICAL CARE IF: The exam and treatment you received today has been provided on an emergency basis only. This is not a substitute for complete medical or dental care. If your problem worsens or new problems (symptoms) appear, and you are unable to meet with your dentist, call or return to this location. SEEK IMMEDIATE MEDICAL CARE IF:   You have a fever.  You develop redness and swelling of your face, jaw, or neck.  You are unable to open your mouth.  You have severe pain uncontrolled by pain medicine. MAKE SURE YOU:   Understand these instructions.  Will watch your condition.  Will get help right away if you are not doing well or get worse. Document Released: 04/10/2005 Document Revised: 07/03/2011 Document Reviewed: 11/27/2007 [facility] Patient Information 2015 [employer], [place]. This  information is not intended to replace advice given to you by your health care provider. Make sure you discuss any questions you have with your health care provider.  Emergency Department Resource Guide 1) Find a Doctor and Pay Out of Pocket Although you won't have to find out who is covered by your insurance plan, it is a good idea to ask around and get recommendations. You will then need to call the office and see if the doctor you have chosen will accept you as a new patient and what types of options they offer for patients who are self-pay. Some doctors offer discounts or will set up payment plans for their patients who do not have insurance, but you will need to ask so you aren't surprised when you get to your appointment.  2) Contact Your Local Health Department Not all health departments have doctors that can see patients for sick visits, but many do, so it is worth a call to see if yours does. If you don't know where your local health department is, you can check in your phone book. The CDC also has a tool to help you locate your state's health department, and many state websites also have listings of all of their local health departments.  3) Find a Walk-in Clinic If your illness is not likely to be very severe or complicated, you may want to try a walk in clinic. These are popping up all over the country in pharmacies, drugstores, and shopping centers. They're usually staffed by nurse practitioners or physician assistants that have been trained to treat common illnesses  and complaints. They're usually fairly quick and inexpensive. However, if you have serious medical issues or chronic medical problems, these are probably not your best option.  No Primary Care Doctor: - Call Health Connect at  [phone] - they can help you locate a primary care doctor that  accepts your insurance, provides certain services, etc. - Physician Referral Service- [phone]  Chronic Pain  Problems: Organization         Address  Phone   Notes  [facility]  [phone] Patients need to be referred by their primary care doctor.   Medication Assistance: Organization         Address  Phone   Notes  [facility] [address] [place], [place] [address] [phone] --Must be a resident of [facility] -- Must have NO insurance coverage whatsoever (no Medicaid/ Medicare, etc.) -- The pt. MUST have a primary care doctor that directs their care regularly and follows them in the community   [facility]ist  [phone]   [employer]  [phone]    Agencies that provide inexpensive medical care: Organization         Address  Phone   Notes  [place]  [phone]   [facility]    [phone]   [facility] [place], [place] [address] [phone]   [place] [address] [place] [phone]   Planned Parenthood    [phone]   [facility]    [phone]   [place] and [address], [place] Phone:  [phone], Fax:  [phone] Hours of Operation:  9 am - 6 pm, M-F.  Also accepts Medicaid/Medicare and self-pay.  [facility] [address], [address], [place] Phone: [phone], Fax: [phone]. Hours of Operation:  8:30 am - 5:30 pm, M-F.  Also accepts Medicaid and self-pay.  [facility] [place] [address], [place] Phone: [phone]   [place], [place], [place] [phone], Ext. [phone] Mondays & Thursdays: 7-9 AM.  First 15 patients are seen on a first come, first serve basis.    [place] Providers:  Organization         Address  Phone   Notes  [facility] [address], [place] [phone] Also  accepts self-pay patients.  [facility] [id number] [place], [place]  [phone]   [place], [address], [place] [phone]   [facility] [address], [place] [phone]   Lucianne Lei [address], [place]   [phone] Only accepts [place] Access [place] patients after they have their name applied to their card.   Self-Pay (no insurance) in [facility]:  Organization         Address  Phone   Notes  Sickle Cell Patients, [facility] [phone]   [facility] [place] [phone]   [facility] [place]  [address], [place] [phone]   [facility]/Dr. Osei-Bonsu  [address] [place] or [address], [place] [phone] Phone number for both [facility] and [place] locations is the same.  Urgent Medical and [facility] [address] [place] [phone]   [facility] [address] [place] or [address] [phone]   [facility] [place] [address], [place] [phone], phone; [phone], fax Sees patients 1st and 3rd Saturday of every month.  Must not qualify for public or private insurance (i.e. Medicaid, Medicare, [place] Health Choice, Veterans' Benefits)  Household income should be no more than 200% of the poverty level The clinic cannot treat you if you are pregnant or think you are pregnant  Sexually transmitted diseases are not treated at the clinic.    Dental Care: Organization         Address  Phone  Notes  [facility] Department of [facility] [place] [phone] Accepts children up to age 35 who are enrolled in [place] or [place]; pregnant  women with a Medicaid card; and children who have applied for Medicaid or [place] Health Choice, but were declined, whose parents can pay a reduced fee at time of service.  [facility] Department of [facility]  [address], [place] [phone] Accepts children up to age 58 who are enrolled in [place] or [place]; pregnant women with a Medicaid card; and children who have applied for Medicaid or [place] Health Choice, but were declined, whose parents can pay a reduced fee at time of service.  [place] Adult Dental Access PROGRAM  [place] [phone] Patients are seen by appointment only. Walk-ins are not accepted. [place] will see patients 30 years of age and older. Monday - Tuesday (8am-5pm) Most Wednesdays (8:30-5pm) $30 per visit, cash only  [facility] Adult Dental Access PROGRAM  [address], [facility] [phone] Patients are seen by appointment only. Walk-ins are not accepted. [place] will see patients 89 years of age and older. One Wednesday Evening (Monthly: Volunteer Based).  $30 per visit, cash only  [place]  [phone] for adults; Children under age 47, call Graduate Pediatric Dentistry at [phone]. Children aged 38-14, please call [phone] to request a pediatric application.  Dental services are provided in all areas of dental care including fillings, crowns and bridges, complete and partial dentures, implants, gum treatment, root canals, and extractions. Preventive care is also provided. Treatment is provided to both adults and children. Patients are selected via a lottery and there is often a waiting list.   [facility] [address] [place]  [phone] [email or web address]   [facility] [address] [place], [place] [phone], Ext. [phone] Second and Fourth Thursday of each month, opens at 6:30 AM; Clinic ends at 9 AM.  Patients are  seen on a first-come first-served basis, and a limited number are seen during each clinic.   [facility]  [address] Hillard Danker [place], [place] [phone]   Eligibility Requirements You must have lived in [place], [place], or [place] counties for at least the last three months.   You cannot be eligible for state or federal sponsored [employer], including [employer], [place], or [employer].   You generally cannot be eligible for healthcare insurance through your employer.    How to apply: Eligibility screenings are held every Tuesday and Wednesday  afternoon from 1:00 pm until 4:00 pm. You do not need an appointment for the interview!  [facility] [address], [place], [place]   [place]  [place] Department  [place]  [phone]    Behavioral Health Resources in the Community: Intensive Outpatient Programs Organization         Address  Phone  Notes  [place]. [address], [place], [place] [phone]   [facility] [address] [place], [place]   ADS: Alcohol & Drug Svcs [address] [place], [place]   [place] [address]  [place], [place] or [phone]   Substance Abuse Resources Organization         Address  Phone  Notes  Alcohol and Drug Services  [phone]   [place]  [phone]   [facility] [place]   Chinita Pester  [phone]   Residential & Outpatient Substance Abuse Program  [phone]   Psychological Services Organization         Address  Phone  Notes  [facility] [place]  [place]  [phone]   [place] [address] [place] or [phone]    Mobile Crisis  Teams Organization         Address  Phone  Notes  [facility], Mobile Crisis Care Unit  [phone]   [facility]ive Psychotherapeutic Services  [address] [place], [place]   [place] [address], [place] [phone]    Self-Help/Support Groups Organization         Address  Phone             Notes  [place]. of [place] - variety of support groups  [place] Call for more information  Narcotics Anonymous (NA), [facility] [address], [employer] [place]  2 meetings at this location   Special educational needs teacher         Address  Phone  Notes  ASAP Residential Treatment [place],    [place]  [phone]   [facility]  [address], [place] [id number], [place], [place]   [place], [place] [phone] Admissions: 8am-3pm M-F  Incentives Substance [place] [address],    [place], [place] [phone]   [facility] [address] [place], [place], [place]   [facility] [address]  [place], [place]   Insight Programs - Intensive Outpatient [address]een [address], [place], [place]   [facility] ([place].,  [place], [place] [phone] or [phone]   Residential Treatment Services (RTS) [address], [place], [place] Accepts Medicaid  [facility] [place] [address]  [place] [phone] Substance Abuse/Addiction Treatment   [facility] Organization         Address  Phone  Notes  [facility]  [phone]   Domenic Schwab, PhD [address] Arlis Porta [place], [place]   [phone] or [phone]   [place], [place] [phone]   [place] [address] [place], [place] [phone]  Insurance/Medicaid/sponsorship through [employer] and  Families [address], [address] [place], [place] [phone] [place], [place] [phone]    Dr. Adele Schilder  [phone]   [facility] Dept. 1) [address] [place] 2) [place] 3)  [address], [place] [phone]  [phone]   [place] [phone] or [phone] (After Hours)

## 2014-09-05 NOTE — ED Notes (Signed)
Husband requests to speak with charge nurse, Elliot GurneyWoody RN notified

## 2014-09-05 NOTE — ED Notes (Signed)
PA at bedside.

## 2014-09-05 NOTE — ED Provider Notes (Signed)
CSN: [id number]     Arrival date & time 09/05/14  1854 History   First MD Initiated Contact with Patient 09/05/14 2005     Chief Complaint  Patient presents with  . Dental Pain    (Consider location/radiation/quality/duration/timing/severity/associated sxs/prior Treatment) HPI Comments: Patient is a 34 year old female with a history of sickle cell trait and anemia who presents to the emergency department for further evaluation of right-sided upper dental pain. Patient reports that she has had pain over the last 2 days. Pain is recurrent, but has not been present for 2 months prior to most recent onset. She describes the pain as burning and throbbing. She denies taking any medications for symptoms. She states that she did not want to take anything that would harm her baby. She is 5 months pregnant. Patient denies abdominal pain and vaginal bleeding or discharge. No reported fevers. She has had some mild bleeding from around the tooth. Patient also denies facial swelling and purulent drainage. No inability to swallow or drooling. Patient does not have a dentist.  Patient is a 34 y.o. female presenting with tooth pain. The history is provided by the patient and the spouse. No language interpreter was used.  Dental Pain   Past Medical History  Diagnosis Date  . Sickle cell trait   . Anemia    Past Surgical History  Procedure Laterality Date  . No past surgeries     History reviewed. No pertinent family history. History  Substance Use Topics  . Smoking status: Never Smoker   . Smokeless tobacco: Never Used  . Alcohol Use: No   OB History    Gravida Para Term Preterm AB TAB SAB Ectopic Multiple Living   5 2 2  2  2   2       Review of Systems  HENT: Positive for dental problem.   All other systems reviewed and are negative.   Allergies  Review of patient's allergies indicates no known allergies.  Home Medications   Prior to Admission medications   Medication Sig Start Date  End Date Taking? Authorizing Provider  ACCU-CHEK FASTCLIX LANCETS MISC Inject 1 each into the skin 4 (four) times daily. O24.419 GDM for testing 4 times daily 07/27/14   Tereso NewcomerUgonna A Anyanwu, MD  aspirin EC 81 MG tablet Take 1 tablet (81 mg total) by mouth daily. 08/17/14   Fredirick LatheKristy Acosta, MD  glucose blood (ACCU-CHEK SMARTVIEW) test strip DX Gestational Diabetes O24.419 for testing 4 times daily 07/27/14   Tereso NewcomerUgonna A Anyanwu, MD  Prenatal Vit-Fe Fumarate-FA (PRENATAL MULTIVITAMIN) TABS Take 1 tablet by mouth daily at 12 noon.    Historical Provider, MD  promethazine (PHENERGAN) 12.5 MG tablet Take 1 tablet (12.5 mg total) by mouth every 6 (six) hours as needed for nausea. Patient not taking: Reported on 08/03/2014 08/09/12   Verita SchneidersEvelyn M Key, NP   BP 101/61 mmHg  Pulse 77  Temp(Src) 97.8 F (36.6 C) (Oral)  Resp 18  Wt 157 lb 8 oz (71.442 kg)  SpO2 99%  LMP 04/23/2014   Physical Exam  Constitutional: She is oriented to person, place, and time. She appears well-developed and well-nourished. No distress.  Nontoxic/nonseptic appearing  HENT:  Head: Normocephalic and atraumatic.  Mouth/Throat: Uvula is midline, oropharynx is clear and moist and mucous membranes are normal.    No gingival swelling or fluctuance. No trismus. Midline. Patient tolerating secretions without difficulty.  Eyes: Conjunctivae and EOM are normal. No scleral icterus.  Neck: Normal range of motion.  No nuchal rigidity or meningismus  Pulmonary/Chest: Effort normal. No respiratory distress.  Respirations even and unlabored  Musculoskeletal: Normal range of motion.  Neurological: She is alert and oriented to person, place, and time. She exhibits normal muscle tone. Coordination normal.  Skin: Skin is warm and dry. No rash noted. She is not diaphoretic. No erythema. No pallor.  Psychiatric: She has a normal mood and affect. Her behavior is normal.  Nursing note and vitals reviewed.   ED Course  Dental Date/Time: 09/05/2014 8:22  PM Performed by: Antony MaduraHUMES, Laylah Riga Authorized by: Antony MaduraHUMES, Jordyan Hardiman Consent: The procedure was performed in an emergent situation. Verbal consent obtained. Written consent not obtained. Risks and benefits: risks, benefits and alternatives were discussed Consent given by: patient and spouse Patient understanding: patient states understanding of the procedure being performed Patient consent: the patient's understanding of the procedure matches consent given Procedure consent: procedure consent matches procedure scheduled Relevant documents: relevant documents present and verified Test results: test results available and properly labeled Site marked: the operative site was marked Imaging studies: imaging studies available Required items: required blood products, implants, devices, and special equipment available Patient identity confirmed: verbally with patient and arm band Preparation: Patient was prepped and draped in the usual sterile fashion. Local anesthesia used: yes Anesthesia: nerve block Local anesthetic: bupivacaine 0.5% with epinephrine Anesthetic total: 1.8 ml Patient tolerance: Patient tolerated the procedure well with no immediate complications Comments: MSA nerve block for dentalgia. Patient tolerated well without complications.   (including critical care time) Labs Review Labs Reviewed - No data to display  Imaging Review No results found.   EKG Interpretation None      MDM   Final diagnoses:  Dentalgia    Patient with toothache. She is 5 months pregnant. No gross abscess. Exam unconcerning for Ludwig's angina or spread of infection. Will treat with Amoxicillin and Tylenol. Dental block given in ED with relief of pain; pain now rated 0/10. Urged patient to follow-up with dentist. Referral and resource guide provided. Patient agreeable to plan with no unaddressed concerns.   Filed Vitals:   09/05/14 1900 09/05/14 2101  BP: 101/61 104/59  Pulse: 77 76  Temp: 97.8 F  (36.6 C) 97.7 F (36.5 C)  TempSrc: Oral Oral  Resp: 18 12  Weight: 157 lb 8 oz (71.442 kg)   SpO2: 99% 100%       Antony MaduraKelly Alaster Asfaw, PA-C 09/05/14 2111  Jerelyn ScottMartha Linker, MD 09/05/14 2122

## 2014-09-05 NOTE — ED Notes (Signed)
Pt reports right side upper dental pain since yesterday. Pt is approx 5 months pregnant but denies any complaints regarding baby. Airway intact.

## 2014-09-07 ENCOUNTER — Ambulatory Visit (INDEPENDENT_AMBULATORY_CARE_PROVIDER_SITE_OTHER): Payer: Medicaid Other | Admitting: Obstetrics & Gynecology

## 2014-09-07 VITALS — BP 119/58 | HR 72 | Wt 155.8 lb

## 2014-09-07 DIAGNOSIS — O09292 Supervision of pregnancy with other poor reproductive or obstetric history, second trimester: Secondary | ICD-10-CM

## 2014-09-07 DIAGNOSIS — O0992 Supervision of high risk pregnancy, unspecified, second trimester: Secondary | ICD-10-CM

## 2014-09-07 DIAGNOSIS — Z8632 Personal history of gestational diabetes: Secondary | ICD-10-CM

## 2014-09-07 DIAGNOSIS — O24419 Gestational diabetes mellitus in pregnancy, unspecified control: Secondary | ICD-10-CM

## 2014-09-07 LAB — POCT URINALYSIS DIP (DEVICE)
BILIRUBIN URINE: NEGATIVE
GLUCOSE, UA: NEGATIVE mg/dL
Hgb urine dipstick: NEGATIVE
KETONES UR: NEGATIVE mg/dL
LEUKOCYTES UA: NEGATIVE
NITRITE: NEGATIVE
Protein, ur: NEGATIVE mg/dL
Specific Gravity, Urine: 1.025 (ref 1.005–1.030)
Urobilinogen, UA: 0.2 mg/dL (ref 0.0–1.0)
pH: 5.5 (ref 5.0–8.0)

## 2014-09-07 NOTE — Progress Notes (Signed)
Fetal echo 09/29/14 @ 1030a with Dr. Elizebeth Brookingotton.

## 2014-09-07 NOTE — Progress Notes (Signed)
Blood sugars are within range. Only 2 dinner PP, patient reports she normally does not eat dinner Patient encouraged to take Aspirin as prescribed for preeclampsia prophylaxis; she was also told that it helps with IUGR and PTL prphylaxis Declines quad screen, anatomy scan scheduled.  Fetal Echo and Optho exam ordered No other complaints or concerns.  Routine obstetric precautions reviewed.

## 2014-09-07 NOTE — Patient Instructions (Signed)
Return to clinic for any obstetric concerns or go to [facility] for evaluation  

## 2014-09-08 ENCOUNTER — Encounter: Payer: Self-pay | Admitting: *Deleted

## 2014-09-11 ENCOUNTER — Ambulatory Visit (HOSPITAL_COMMUNITY)
Admission: RE | Admit: 2014-09-11 | Discharge: 2014-09-11 | Disposition: A | Discharge status: Home / Self Care | Payer: Medicaid Other | Source: Ambulatory Visit | Attending: Family Medicine | Admitting: Family Medicine

## 2014-09-11 DIAGNOSIS — O24419 Gestational diabetes mellitus in pregnancy, unspecified control: Secondary | ICD-10-CM

## 2014-09-11 DIAGNOSIS — Z3A20 20 weeks gestation of pregnancy: Secondary | ICD-10-CM | POA: Insufficient documentation

## 2014-09-11 DIAGNOSIS — Z3689 Encounter for other specified antenatal screening: Secondary | ICD-10-CM | POA: Insufficient documentation

## 2014-09-28 ENCOUNTER — Ambulatory Visit (INDEPENDENT_AMBULATORY_CARE_PROVIDER_SITE_OTHER): Payer: Medicaid Other | Admitting: Obstetrics and Gynecology

## 2014-09-28 VITALS — BP 100/54 | HR 78 | Temp 98.3°F | Wt 156.8 lb

## 2014-09-28 DIAGNOSIS — O24419 Gestational diabetes mellitus in pregnancy, unspecified control: Secondary | ICD-10-CM

## 2014-09-28 LAB — POCT URINALYSIS DIP (DEVICE)
BILIRUBIN URINE: NEGATIVE
GLUCOSE, UA: NEGATIVE mg/dL
Hgb urine dipstick: NEGATIVE
KETONES UR: NEGATIVE mg/dL
NITRITE: NEGATIVE
PH: 7 (ref 5.0–8.0)
Protein, ur: 30 mg/dL — AB
Specific Gravity, Urine: 1.02 (ref 1.005–1.030)
UROBILINOGEN UA: 1 mg/dL (ref 0.0–1.0)

## 2014-09-28 MED ORDER — ASPIRIN EC 81 MG PO TBEC: 81.0000 mg | DELAYED_RELEASE_TABLET | Freq: Every day | ORAL | Status: DC

## 2014-09-28 NOTE — Progress Notes (Signed)
Subjective:   Carolyn Caldwell is a 34 y.o. [id number] at 4450w4d being seen today for her obstetrical visit.  Patient reports no complaints.   Contractions: Contractions: Not present.   Vaginal Bleeding Vag. Bleeding: None.   Fetal Movement: Movement: Present.   Denies contractions, vaginal bleeding or leaking of fluid.  Reports good fetal movement.  The following portions of the patient's history were reviewed and updated as appropriate: allergies, current medications, past family history, past medical history, past social history, past surgical history and problem list.   Objective:  BP 100/54 mmHg  Pulse 78  Temp(Src) 98.3 F (36.8 C)  Wt 156 lb 12.8 oz (71.124 kg)  LMP 04/23/2014 Fetal Heart Rate: Fetal Heart Rate (bpm): 141  Fundal Height:    Fetal Movement: Movement: Present  Fetal Presentation:     Abdomen: Soft, gravid, appropriate for gestational age.  Pain/Pressure: Pain/Pressure: Absent     Vaginal: Vaginal Bleeding Vag. Bleeding: None   Discharge:    Cervix: Dilation:   Effacement:   Station:    Extremities: Edema: Edema: None   Urinalysis: Protein: Urine Protein: 1+ Glucose: Urine Glucose: Negative Results for orders placed or performed in visit on 09/28/14 (from the past 24 hour(s))  POCT urinalysis dip (device)     Status: Abnormal   Collection Time: 09/28/14  8:25 AM  Result Value Ref Range   Glucose, UA NEGATIVE NEGATIVE mg/dL   Bilirubin Urine NEGATIVE NEGATIVE   Ketones, ur NEGATIVE NEGATIVE mg/dL   Specific Gravity, Urine 1.020 1.005 - 1.030   Hgb urine dipstick NEGATIVE NEGATIVE   pH 7.0 5.0 - 8.0   Protein, ur 30 (A) NEGATIVE mg/dL   Urobilinogen, UA 1.0 0.0 - 1.0 mg/dL   Nitrite NEGATIVE NEGATIVE   Leukocytes, UA TRACE (A) NEGATIVE     Assessment and Plan:   Pregnancy:  [id number] at 3350w4d  1. Gestational diabetes mellitus, antepartum Reviewed blood glucose log today and mostly well controlled. AM Fasting starting to creep up in the last week.  Thinks this is related to eating fruit right before bed. Will work on diet and continue to check blood sugars. If no improvement in control by next visit will need to start medication. Patient aware.  Continue diet/exercise.  Anatomy scan normal although incomplete visualization of heart and spine. Follow up scan in 4 weeks to complete anatomy.  Fetal echo scheduled.  - aspirin EC 81 MG tablet; Take 1 tablet (81 mg total) by mouth daily.  Dispense: 60 tablet; Refill: 3   Preterm labor symptoms: vaginal bleeding, contractions and leaking of fluid reviewed in detail.  Fetal movement precautions reviewed.  Follow up in 3 weeks.   William DaltonMorgan Aven Christen, MD

## 2014-10-12 ENCOUNTER — Ambulatory Visit (INDEPENDENT_AMBULATORY_CARE_PROVIDER_SITE_OTHER): Payer: Medicaid Other | Admitting: Obstetrics & Gynecology

## 2014-10-12 VITALS — BP 117/50 | HR 75 | Temp 98.1°F | Wt 154.1 lb

## 2014-10-12 DIAGNOSIS — O24419 Gestational diabetes mellitus in pregnancy, unspecified control: Secondary | ICD-10-CM

## 2014-10-12 LAB — POCT URINALYSIS DIP (DEVICE)
Bilirubin Urine: NEGATIVE
Glucose, UA: NEGATIVE mg/dL
Hgb urine dipstick: NEGATIVE
Ketones, ur: NEGATIVE mg/dL
Nitrite: NEGATIVE
Protein, ur: NEGATIVE mg/dL
Specific Gravity, Urine: 1.02 (ref 1.005–1.030)
Urobilinogen, UA: 0.2 mg/dL (ref 0.0–1.0)
pH: 6.5 (ref 5.0–8.0)

## 2014-10-12 NOTE — Patient Instructions (Signed)
Second Trimester of Pregnancy The second trimester is from week 13 through week 28, months 4 through 6. The second trimester is often a time when you feel your best. Your body has also adjusted to being pregnant, and you begin to feel better physically. Usually, morning sickness has lessened or quit completely, you may have more energy, and you may have an increase in appetite. The second trimester is also a time when the fetus is growing rapidly. At the end of the sixth month, the fetus is about 9 inches long and weighs about 1 pounds. You will likely begin to feel the baby move (quickening) between 18 and 20 weeks of the pregnancy. BODY CHANGES Your body goes through many changes during pregnancy. The changes vary from woman to woman.   Your weight will continue to increase. You will notice your lower abdomen bulging out.  You may begin to get stretch marks on your hips, abdomen, and breasts.  You may develop headaches that can be relieved by medicines approved by your health care provider.  You may urinate more often because the fetus is pressing on your bladder.  You may develop or continue to have heartburn as a result of your pregnancy.  You may develop constipation because certain hormones are causing the muscles that push waste through your intestines to slow down.  You may develop hemorrhoids or swollen, bulging veins (varicose veins).  You may have back pain because of the weight gain and pregnancy hormones relaxing your joints between the bones in your pelvis and as a result of a shift in weight and the muscles that support your balance.  Your breasts will continue to grow and be tender.  Your gums may bleed and may be sensitive to brushing and flossing.  Dark spots or blotches (chloasma, mask of pregnancy) may develop on your face. This will likely fade after the baby is born.  A dark line from your belly button to the pubic area (linea nigra) may appear. This will likely fade  after the baby is born.  You may have changes in your hair. These can include thickening of your hair, rapid growth, and changes in texture. Some women also have hair loss during or after pregnancy, or hair that feels dry or thin. Your hair will most likely return to normal after your baby is born. WHAT TO EXPECT AT YOUR PRENATAL VISITS During a routine prenatal visit:  You will be weighed to make sure you and the fetus are growing normally.  Your blood pressure will be taken.  Your abdomen will be measured to track your baby's growth.  The fetal heartbeat will be listened to.  Any test results from the previous visit will be discussed. Your health care provider may ask you:  How you are feeling.  If you are feeling the baby move.  If you have had any abnormal symptoms, such as leaking fluid, bleeding, severe headaches, or abdominal cramping.  If you have any questions. Other tests that may be performed during your second trimester include:  Blood tests that check for:  Low iron levels (anemia).  Gestational diabetes (between 24 and 28 weeks).  Rh antibodies.  Urine tests to check for infections, diabetes, or protein in the urine.  An ultrasound to confirm the proper growth and development of the baby.  An amniocentesis to check for possible genetic problems.  Fetal screens for spina bifida and Down syndrome. HOME CARE INSTRUCTIONS   Avoid all smoking, herbs, alcohol, and unprescribed   drugs. These chemicals affect the formation and growth of the baby.  Follow your health care provider's instructions regarding medicine use. There are medicines that are either safe or unsafe to take during pregnancy.  Exercise only as directed by your health care provider. Experiencing uterine cramps is a good sign to stop exercising.  Continue to eat regular, healthy meals.  Wear a good support bra for breast tenderness.  Do not use hot tubs, steam rooms, or saunas.  Wear your  seat belt at all times when driving.  Avoid raw meat, uncooked cheese, cat litter boxes, and soil used by cats. These carry germs that can cause birth defects in the baby.  Take your prenatal vitamins.  Try taking a stool softener (if your health care provider approves) if you develop constipation. Eat more high-fiber foods, such as fresh vegetables or fruit and whole grains. Drink plenty of fluids to keep your urine clear or pale yellow.  Take warm sitz baths to soothe any pain or discomfort caused by hemorrhoids. Use hemorrhoid cream if your health care provider approves.  If you develop varicose veins, wear support hose. Elevate your feet for 15 minutes, 3-4 times a day. Limit salt in your diet.  Avoid heavy lifting, wear low heel shoes, and practice good posture.  Rest with your legs elevated if you have leg cramps or low back pain.  Visit your dentist if you have not gone yet during your pregnancy. Use a soft toothbrush to brush your teeth and be gentle when you floss.  A sexual relationship may be continued unless your health care provider directs you otherwise.  Continue to go to all your prenatal visits as directed by your health care provider. SEEK MEDICAL CARE IF:   You have dizziness.  You have mild pelvic cramps, pelvic pressure, or nagging pain in the abdominal area.  You have persistent nausea, vomiting, or diarrhea.  You have a bad smelling vaginal discharge.  You have pain with urination. SEEK IMMEDIATE MEDICAL CARE IF:   You have a fever.  You are leaking fluid from your vagina.  You have spotting or bleeding from your vagina.  You have severe abdominal cramping or pain.  You have rapid weight gain or loss.  You have shortness of breath with chest pain.  You notice sudden or extreme swelling of your face, hands, ankles, feet, or legs.  You have not felt your baby move in over an hour.  You have severe headaches that do not go away with  medicine.  You have vision changes. Document Released: 04/04/2001 Document Revised: 04/15/2013 Document Reviewed: 06/11/2012 Exit[employer] Patient Information 2015 [employer]. This information is not intended to replace advice given to you by your health care provider. Make sure you discuss any questions you have with your health care provider.  

## 2014-10-12 NOTE — Progress Notes (Signed)
Subjective:few sl elevated FBS  Carolyn Caldwell is a 34 y.o. [id number] at [redacted]w[redacted]d being seen today for ongoing prenatal care.  Patient reports no complaints.  Contractions: Not present.  Vag. Bleeding: None. Movement: Present. Denies leaking of fluid.   The following portions of the patient's history were reviewed and updated as appropriate: allergies, current medications, past family history, past medical history, past social history, past surgical history and problem list.   Objective:   Filed Vitals:   10/12/14 0810  BP: 117/50  Pulse: 75  Temp: 98.1 F (36.7 C)  Weight: 154 lb 1.6 oz (69.899 kg)    Fetal Status: Fetal Heart Rate (bpm): 145   Movement: Present     General:  Alert, oriented and cooperative. Patient is in no acute distress.  Skin: Skin is warm and dry. No rash noted.      Respiratory:  no problems with respiration noted  Abdomen: Soft, gravid, appropriate for gestational age. Pain/Pressure: Absent     Vaginal: Vag. Bleeding: None.       Cervix: Not evaluated       Extremities: Normal range of motion.  Edema: None  Mental Status: Normal mood and affect. Normal behavior. Normal judgment and thought content.  FBS up to 98, highest PP 122 o/w in range Urinalysis: Urine Protein: Negative Urine Glucose: Negative  Assessment and Plan:  Pregnancy: [id number] at [redacted]w[redacted]d  There are no diagnoses linked to this encounter.  Preterm labor symptoms and general obstetric precautions including but not limited to vaginal bleeding, contractions, leaking of fluid and fetal movement were reviewed in detail with the patient.  Please refer to After Visit Summary for other counseling recommendations.   3 weeks  Adam Phenix, MD

## 2014-10-12 NOTE — Progress Notes (Signed)
Breastfeeding tip of the week reviewed Pt states she is not taking Baby ASA, pharmacy will not give it to her, educated patient she can buy over the counter and to take 1 81 mg tablet per day per MD recommendations Pt does not have email address for MyChart Leukocytes: [employer] [facility] Medicaid form complete

## 2014-11-02 ENCOUNTER — Encounter: Payer: Self-pay | Admitting: Obstetrics and Gynecology

## 2014-11-02 ENCOUNTER — Ambulatory Visit (INDEPENDENT_AMBULATORY_CARE_PROVIDER_SITE_OTHER): Payer: Medicaid Other | Admitting: Obstetrics and Gynecology

## 2014-11-02 VITALS — BP 113/65 | HR 74 | Temp 98.0°F | Wt 153.4 lb

## 2014-11-02 DIAGNOSIS — O99212 Obesity complicating pregnancy, second trimester: Secondary | ICD-10-CM

## 2014-11-02 DIAGNOSIS — E669 Obesity, unspecified: Secondary | ICD-10-CM

## 2014-11-02 DIAGNOSIS — O24410 Gestational diabetes mellitus in pregnancy, diet controlled: Secondary | ICD-10-CM

## 2014-11-02 DIAGNOSIS — Z23 Encounter for immunization: Secondary | ICD-10-CM

## 2014-11-02 DIAGNOSIS — O0992 Supervision of high risk pregnancy, unspecified, second trimester: Secondary | ICD-10-CM

## 2014-11-02 DIAGNOSIS — O24419 Gestational diabetes mellitus in pregnancy, unspecified control: Secondary | ICD-10-CM

## 2014-11-02 DIAGNOSIS — O09293 Supervision of pregnancy with other poor reproductive or obstetric history, third trimester: Secondary | ICD-10-CM

## 2014-11-02 DIAGNOSIS — O99210 Obesity complicating pregnancy, unspecified trimester: Secondary | ICD-10-CM

## 2014-11-02 DIAGNOSIS — Z8632 Personal history of gestational diabetes: Secondary | ICD-10-CM

## 2014-11-02 DIAGNOSIS — O09292 Supervision of pregnancy with other poor reproductive or obstetric history, second trimester: Secondary | ICD-10-CM | POA: Diagnosis not present

## 2014-11-02 LAB — CBC
HCT: 31.3 % — ABNORMAL LOW (ref 36.0–46.0)
HEMOGLOBIN: 10.7 g/dL — AB (ref 12.0–15.0)
MCH: 25.1 pg — ABNORMAL LOW (ref 26.0–34.0)
MCHC: 34.2 g/dL (ref 30.0–36.0)
MCV: 73.3 fL — ABNORMAL LOW (ref 78.0–100.0)
MPV: 9.7 fL (ref 8.6–12.4)
Platelets: 244 10*3/uL (ref 150–400)
RBC: 4.27 MIL/uL (ref 3.87–5.11)
RDW: 15 % (ref 11.5–15.5)
WBC: 5.2 10*3/uL (ref 4.0–10.5)

## 2014-11-02 LAB — POCT URINALYSIS DIP (DEVICE)
Bilirubin Urine: NEGATIVE
GLUCOSE, UA: NEGATIVE mg/dL
HGB URINE DIPSTICK: NEGATIVE
Ketones, ur: NEGATIVE mg/dL
NITRITE: NEGATIVE
Protein, ur: 30 mg/dL — AB
SPECIFIC GRAVITY, URINE: 1.02 (ref 1.005–1.030)
Urobilinogen, UA: 1 mg/dL (ref 0.0–1.0)
pH: 6 (ref 5.0–8.0)

## 2014-11-02 LAB — RPR

## 2014-11-02 MED ORDER — TETANUS-DIPHTH-ACELL PERTUSSIS 5-2.5-18.5 LF-MCG/0.5 IM SUSP
0.5000 mL | Freq: Once | INTRAMUSCULAR | Status: AC
  Administered 2014-11-02: 0.5 mL via INTRAMUSCULAR

## 2014-11-02 NOTE — Progress Notes (Signed)
Reviewed tip of week with patient  

## 2014-11-02 NOTE — Progress Notes (Signed)
Subjective:  Carolyn Caldwell is a 34 y.o. [id number] at 1263w4d being seen today for ongoing prenatal care.  Patient reports no complaints.  Contractions: Not present.  Vag. Bleeding: None. Movement: Present. Denies leaking of fluid.   The following portions of the patient's history were reviewed and updated as appropriate: allergies, current medications, past family history, past medical history, past social history, past surgical history and problem list.   Objective:   Filed Vitals:   11/02/14 0811  BP: 113/65  Pulse: 74  Temp: 98 F (36.7 C)  Weight: 153 lb 6.4 oz (69.582 kg)    Fetal Status: Fetal Heart Rate (bpm): 131   Movement: Present     General:  Alert, oriented and cooperative. Patient is in no acute distress.  Skin: Skin is warm and dry. No rash noted.   Cardiovascular: Normal heart rate noted  Respiratory: Normal respiratory effort, no problems with respiration noted  Abdomen: Soft, gravid, appropriate for gestational age. Pain/Pressure: Present     Vaginal: Vag. Bleeding: None.       Cervix: Not evaluated        Extremities: Normal range of motion.  Edema: None  Mental Status: Normal mood and affect. Normal behavior. Normal judgment and thought content.   Urinalysis: Urine Protein: Negative Urine Glucose: Negative  Assessment and Plan:  Pregnancy: [id number] at 5063w4d  1. Diet controlled gestational diabetes mellitus in third trimester - CBC - RPR - HIV antibody (with reflex) - Tdap (BOOSTRIX) injection 0.5 mL; Inject 0.5 mLs into the muscle once.  2. Supervision of high risk pregnancy, antepartum, second trimester Patient is doing well without complaints.   3. Obesity affecting pregnancy   4. History of gestational diabetes in prior pregnancy, currently pregnant, third trimester   5. A2/B Gestational diabetes mellitus, antepartum CBGs reviewed and all pp within range. Fasting values range from 78-100 majority in the mid 90's. Discussed consuming a protein rich  snack at bedtime   Preterm labor symptoms and general obstetric precautions including but not limited to vaginal bleeding, contractions, leaking of fluid and fetal movement were reviewed in detail with the patient.  Please refer to After Visit Summary for other counseling recommendations.   Return in about 2 weeks (around 11/16/2014).   Catalina AntiguaPeggy Sarvesh Meddaugh, MD

## 2014-11-03 LAB — HIV ANTIBODY (ROUTINE TESTING W REFLEX): HIV 1&2 Ab, 4th Generation: NONREACTIVE

## 2014-11-16 ENCOUNTER — Encounter: Payer: Self-pay | Admitting: Obstetrics and Gynecology

## 2014-11-16 ENCOUNTER — Ambulatory Visit (INDEPENDENT_AMBULATORY_CARE_PROVIDER_SITE_OTHER): Payer: Medicaid Other | Admitting: Obstetrics and Gynecology

## 2014-11-16 VITALS — BP 106/60 | HR 76 | Temp 98.4°F | Wt 152.7 lb

## 2014-11-16 DIAGNOSIS — O26843 Uterine size-date discrepancy, third trimester: Secondary | ICD-10-CM

## 2014-11-16 DIAGNOSIS — O0993 Supervision of high risk pregnancy, unspecified, third trimester: Secondary | ICD-10-CM

## 2014-11-16 DIAGNOSIS — O1213 Gestational proteinuria, third trimester: Secondary | ICD-10-CM | POA: Diagnosis present

## 2014-11-16 DIAGNOSIS — O24419 Gestational diabetes mellitus in pregnancy, unspecified control: Secondary | ICD-10-CM | POA: Diagnosis not present

## 2014-11-16 LAB — POCT URINALYSIS DIP (DEVICE)
BILIRUBIN URINE: NEGATIVE
Glucose, UA: NEGATIVE mg/dL
Hgb urine dipstick: NEGATIVE
Ketones, ur: NEGATIVE mg/dL
NITRITE: NEGATIVE
Protein, ur: 30 mg/dL — AB
Specific Gravity, Urine: 1.02 (ref 1.005–1.030)
Urobilinogen, UA: 1 mg/dL (ref 0.0–1.0)
pH: 7 (ref 5.0–8.0)

## 2014-11-16 NOTE — Progress Notes (Signed)
Subjective:  Carolyn Caldwell is a 34 y.o. [id number] at [redacted]w[redacted]d being seen today for ongoing prenatal care.  Patient reports no complaints.  Contractions: Irregular.  Vag. Bleeding: None. Movement: Present. Denies leaking of fluid.  States had apple cider at party 2 days ago, lots of fruit last 2 days. The following portions of the patient's history were reviewed and updated as appropriate: allergies, current medications, past family history, past medical history, past social history, past surgical history and problem list.   Objective:   Filed Vitals:   11/16/14 0811  BP: 106/60  Pulse: 76  Temp: 98.4 F (36.9 C)  Weight: 152 lb 11.2 oz (69.264 kg)    Fetal Status:     Movement: Present     General:  Alert, oriented and cooperative. Patient is in no acute distress.  Skin: Skin is warm and dry. No rash noted.   Cardiovascular: Normal heart rate noted  Respiratory: Normal respiratory effort, no problems with respiration noted  Abdomen: Soft, gravid, appropriate for gestational age. Pain/Pressure: Present     Vaginal: Vag. Bleeding: None.       Cervix: Not evaluated        Extremities: Normal range of motion.  Edema: None  Mental Status: Normal mood and affect. Normal behavior. Normal judgment and thought content.  O.S. Pterygium  Urinalysis: Urine Protein: 1+ Urine Glucose: Negative Persistent dipstick proeinuria, sm LE> check C&S CBGs all within range except 103 fasting past 2 days> discussed diet with DM counselor. Snack, exercise, eliminate simple sugars Assessment and Plan:  Pregnancy: [id number] at [redacted]w[redacted]d 1. Proteinuria affecting pregnancy in third trimester   2. Supervision of high risk pregnancy, antepartum, third trimester   3. A2/B Gestational diabetes mellitus, antepartum   4. Uterine size date discrepancy pregnancy, third trimester    There are no diagnoses linked to this encounter. Preterm labor symptoms and general obstetric precautions including but not limited to vaginal  bleeding, contractions, leaking of fluid and fetal movement were reviewed in detail with the patient. Please refer to After Visit Summary for other counseling recommendations.  Return in about 2 weeks (around 11/30/2014). Start taking BASA and schedule opthalmology appt [place] for S>D  Danae Orleans, CNM

## 2014-11-16 NOTE — Progress Notes (Signed)
Ultrasound scheduled for 8/2/206 :15 PM.(first available). Referral made to Dr Karleen Hampshire @ [facility]. Left message in their general voicemail to contact to set up appointment. Advised patient o follow up in the AM if she has not heard from them by the end of today.

## 2014-11-16 NOTE — Progress Notes (Addendum)
Reviewed patient glucose log. Doing well. Reeducated times to test. Patient has had much stress in household. Multiple deaths in family. Extended family living in home.

## 2014-11-16 NOTE — Patient Instructions (Signed)
Third Trimester of Pregnancy The third trimester is from week 29 through week 42, months 7 through 9. The third trimester is a time when the fetus is growing rapidly. At the end of the ninth month, the fetus is about 20 inches in length and weighs 6-10 pounds.  BODY CHANGES Your body goes through many changes during pregnancy. The changes vary from woman to woman.   Your weight will continue to increase. You can expect to gain 25-35 pounds (11-16 kg) by the end of the pregnancy.  You may begin to get stretch marks on your hips, abdomen, and breasts.  You may urinate more often because the fetus is moving lower into your pelvis and pressing on your bladder.  You may develop or continue to have heartburn as a result of your pregnancy.  You may develop constipation because certain hormones are causing the muscles that push waste through your intestines to slow down.  You may develop hemorrhoids or swollen, bulging veins (varicose veins).  You may have pelvic pain because of the weight gain and pregnancy hormones relaxing your joints between the bones in your pelvis. Backaches may result from overexertion of the muscles supporting your posture.  You may have changes in your hair. These can include thickening of your hair, rapid growth, and changes in texture. Some women also have hair loss during or after pregnancy, or hair that feels dry or thin. Your hair will most likely return to normal after your baby is born.  Your breasts will continue to grow and be tender. A yellow discharge may leak from your breasts called colostrum.  Your belly button may stick out.  You may feel short of breath because of your expanding uterus.  You may notice the fetus "dropping," or moving lower in your abdomen.  You may have a bloody mucus discharge. This usually occurs a few days to a week before labor begins.  Your cervix becomes thin and soft (effaced) near your due date. WHAT TO EXPECT AT YOUR PRENATAL  EXAMS  You will have prenatal exams every 2 weeks until week 36. Then, you will have weekly prenatal exams. During a routine prenatal visit:  You will be weighed to make sure you and the fetus are growing normally.  Your blood pressure is taken.  Your abdomen will be measured to track your baby's growth.  The fetal heartbeat will be listened to.  Any test results from the previous visit will be discussed.  You may have a cervical check near your due date to see if you have effaced. At around 36 weeks, your caregiver will check your cervix. At the same time, your caregiver will also perform a test on the secretions of the vaginal tissue. This test is to determine if a type of bacteria, Group B streptococcus, is present. Your caregiver will explain this further. Your caregiver may ask you:  What your birth plan is.  How you are feeling.  If you are feeling the baby move.  If you have had any abnormal symptoms, such as leaking fluid, bleeding, severe headaches, or abdominal cramping.  If you have any questions. Other tests or screenings that may be performed during your third trimester include:  Blood tests that check for low iron levels (anemia).  Fetal testing to check the health, activity level, and growth of the fetus. Testing is done if you have certain medical conditions or if there are problems during the pregnancy. FALSE LABOR You may feel small, irregular contractions that   eventually go away. These are called Braxton Hicks contractions, or false labor. Contractions may last for hours, days, or even weeks before true labor sets in. If contractions come at regular intervals, intensify, or become painful, it is best to be seen by your caregiver.  SIGNS OF LABOR   Menstrual-like cramps.  Contractions that are 5 minutes apart or less.  Contractions that start on the top of the uterus and spread down to the lower abdomen and back.  A sense of increased pelvic pressure or back  pain.  A watery or bloody mucus discharge that comes from the vagina. If you have any of these signs before the 37th week of pregnancy, call your caregiver right away. You need to go to the hospital to get checked immediately. HOME CARE INSTRUCTIONS   Avoid all smoking, herbs, alcohol, and unprescribed drugs. These chemicals affect the formation and growth of the baby.  Follow your caregiver's instructions regarding medicine use. There are medicines that are either safe or unsafe to take during pregnancy.  Exercise only as directed by your caregiver. Experiencing uterine cramps is a good sign to stop exercising.  Continue to eat regular, healthy meals.  Wear a good support bra for breast tenderness.  Do not use hot tubs, steam rooms, or saunas.  Wear your seat belt at all times when driving.  Avoid raw meat, uncooked cheese, cat litter boxes, and soil used by cats. These carry germs that can cause birth defects in the baby.  Take your prenatal vitamins.  Try taking a stool softener (if your caregiver approves) if you develop constipation. Eat more high-fiber foods, such as fresh vegetables or fruit and whole grains. Drink plenty of fluids to keep your urine clear or pale yellow.  Take warm sitz baths to soothe any pain or discomfort caused by hemorrhoids. Use hemorrhoid cream if your caregiver approves.  If you develop varicose veins, wear support hose. Elevate your feet for 15 minutes, 3-4 times a day. Limit salt in your diet.  Avoid heavy lifting, wear low heal shoes, and practice good posture.  Rest a lot with your legs elevated if you have leg cramps or low back pain.  Visit your dentist if you have not gone during your pregnancy. Use a soft toothbrush to brush your teeth and be gentle when you floss.  A sexual relationship may be continued unless your caregiver directs you otherwise.  Do not travel far distances unless it is absolutely necessary and only with the approval  of your caregiver.  Take prenatal classes to understand, practice, and ask questions about the labor and delivery.  Make a trial run to the hospital.  Pack your hospital bag.  Prepare the baby's nursery.  Continue to go to all your prenatal visits as directed by your caregiver. SEEK MEDICAL CARE IF:  You are unsure if you are in labor or if your water has broken.  You have dizziness.  You have mild pelvic cramps, pelvic pressure, or nagging pain in your abdominal area.  You have persistent nausea, vomiting, or diarrhea.  You have a bad smelling vaginal discharge.  You have pain with urination. SEEK IMMEDIATE MEDICAL CARE IF:   You have a fever.  You are leaking fluid from your vagina.  You have spotting or bleeding from your vagina.  You have severe abdominal cramping or pain.  You have rapid weight loss or gain.  You have shortness of breath with chest pain.  You notice sudden or extreme swelling   of your face, hands, ankles, feet, or legs.  You have not felt your baby move in over an hour.  You have severe headaches that do not go away with medicine.  You have vision changes. Document Released: 04/04/2001 Document Revised: 04/15/2013 Document Reviewed: 06/11/2012 [employer] Patient Information 2015 [employer]. This information is not intended to replace advice given to you by your health care provider. Make sure you discuss any questions you have with your health care provider.  

## 2014-11-19 LAB — CULTURE, OB URINE: Colony Count: 50000

## 2014-11-24 ENCOUNTER — Ambulatory Visit (HOSPITAL_COMMUNITY)
Admission: RE | Admit: 2014-11-24 | Discharge: 2014-11-24 | Disposition: A | Discharge status: Home / Self Care | Payer: Medicaid Other | Source: Ambulatory Visit | Attending: Obstetrics and Gynecology | Admitting: Obstetrics and Gynecology

## 2014-11-24 ENCOUNTER — Encounter (HOSPITAL_COMMUNITY): Payer: Self-pay

## 2014-11-24 VITALS — BP 109/55 | HR 79 | Wt 153.0 lb

## 2014-11-24 DIAGNOSIS — O26843 Uterine size-date discrepancy, third trimester: Secondary | ICD-10-CM | POA: Diagnosis not present

## 2014-11-24 DIAGNOSIS — O24419 Gestational diabetes mellitus in pregnancy, unspecified control: Secondary | ICD-10-CM | POA: Diagnosis present

## 2014-11-24 DIAGNOSIS — IMO0002 Reserved for concepts with insufficient information to code with codable children: Secondary | ICD-10-CM

## 2014-11-30 ENCOUNTER — Other Ambulatory Visit (HOSPITAL_COMMUNITY): Payer: Self-pay | Admitting: Maternal and Fetal Medicine

## 2014-11-30 ENCOUNTER — Ambulatory Visit (INDEPENDENT_AMBULATORY_CARE_PROVIDER_SITE_OTHER): Payer: Medicaid Other | Admitting: Obstetrics & Gynecology

## 2014-11-30 ENCOUNTER — Encounter: Payer: Self-pay | Admitting: Obstetrics & Gynecology

## 2014-11-30 VITALS — BP 91/52 | HR 76 | Temp 98.5°F | Wt 149.2 lb

## 2014-11-30 DIAGNOSIS — O24419 Gestational diabetes mellitus in pregnancy, unspecified control: Secondary | ICD-10-CM | POA: Diagnosis not present

## 2014-11-30 DIAGNOSIS — O26843 Uterine size-date discrepancy, third trimester: Secondary | ICD-10-CM

## 2014-11-30 DIAGNOSIS — Z3A34 34 weeks gestation of pregnancy: Secondary | ICD-10-CM

## 2014-11-30 DIAGNOSIS — Z3A33 33 weeks gestation of pregnancy: Secondary | ICD-10-CM

## 2014-11-30 DIAGNOSIS — O0993 Supervision of high risk pregnancy, unspecified, third trimester: Secondary | ICD-10-CM | POA: Diagnosis not present

## 2014-11-30 DIAGNOSIS — O24410 Gestational diabetes mellitus in pregnancy, diet controlled: Secondary | ICD-10-CM

## 2014-11-30 DIAGNOSIS — Z3A31 31 weeks gestation of pregnancy: Secondary | ICD-10-CM

## 2014-11-30 DIAGNOSIS — Z3A32 32 weeks gestation of pregnancy: Secondary | ICD-10-CM

## 2014-11-30 LAB — POCT URINALYSIS DIP (DEVICE)
BILIRUBIN URINE: NEGATIVE
Glucose, UA: NEGATIVE mg/dL
Hgb urine dipstick: NEGATIVE
KETONES UR: NEGATIVE mg/dL
NITRITE: NEGATIVE
PH: 6 (ref 5.0–8.0)
PROTEIN: 30 mg/dL — AB
SPECIFIC GRAVITY, URINE: 1.025 (ref 1.005–1.030)
Urobilinogen, UA: 0.2 mg/dL (ref 0.0–1.0)

## 2014-11-30 NOTE — Patient Instructions (Signed)
Third Trimester of Pregnancy The third trimester is from week 29 through week 42, months 7 through 9. The third trimester is a time when the fetus is growing rapidly. At the end of the ninth month, the fetus is about 20 inches in length and weighs 6-10 pounds.  BODY CHANGES Your body goes through many changes during pregnancy. The changes vary from woman to woman.   Your weight will continue to increase. You can expect to gain 25-35 pounds (11-16 kg) by the end of the pregnancy.  You may begin to get stretch marks on your hips, abdomen, and breasts.  You may urinate more often because the fetus is moving lower into your pelvis and pressing on your bladder.  You may develop or continue to have heartburn as a result of your pregnancy.  You may develop constipation because certain hormones are causing the muscles that push waste through your intestines to slow down.  You may develop hemorrhoids or swollen, bulging veins (varicose veins).  You may have pelvic pain because of the weight gain and pregnancy hormones relaxing your joints between the bones in your pelvis. Backaches may result from overexertion of the muscles supporting your posture.  You may have changes in your hair. These can include thickening of your hair, rapid growth, and changes in texture. Some women also have hair loss during or after pregnancy, or hair that feels dry or thin. Your hair will most likely return to normal after your baby is born.  Your breasts will continue to grow and be tender. A yellow discharge may leak from your breasts called colostrum.  Your belly button may stick out.  You may feel short of breath because of your expanding uterus.  You may notice the fetus "dropping," or moving lower in your abdomen.  You may have a bloody mucus discharge. This usually occurs a few days to a week before labor begins.  Your cervix becomes thin and soft (effaced) near your due date. WHAT TO EXPECT AT YOUR PRENATAL  EXAMS  You will have prenatal exams every 2 weeks until week 36. Then, you will have weekly prenatal exams. During a routine prenatal visit:  You will be weighed to make sure you and the fetus are growing normally.  Your blood pressure is taken.  Your abdomen will be measured to track your baby's growth.  The fetal heartbeat will be listened to.  Any test results from the previous visit will be discussed.  You may have a cervical check near your due date to see if you have effaced. At around 36 weeks, your caregiver will check your cervix. At the same time, your caregiver will also perform a test on the secretions of the vaginal tissue. This test is to determine if a type of bacteria, Group B streptococcus, is present. Your caregiver will explain this further. Your caregiver may ask you:  What your birth plan is.  How you are feeling.  If you are feeling the baby move.  If you have had any abnormal symptoms, such as leaking fluid, bleeding, severe headaches, or abdominal cramping.  If you have any questions. Other tests or screenings that may be performed during your third trimester include:  Blood tests that check for low iron levels (anemia).  Fetal testing to check the health, activity level, and growth of the fetus. Testing is done if you have certain medical conditions or if there are problems during the pregnancy. FALSE LABOR You may feel small, irregular contractions that   eventually go away. These are called Braxton Hicks contractions, or false labor. Contractions may last for hours, days, or even weeks before true labor sets in. If contractions come at regular intervals, intensify, or become painful, it is best to be seen by your caregiver.  SIGNS OF LABOR   Menstrual-like cramps.  Contractions that are 5 minutes apart or less.  Contractions that start on the top of the uterus and spread down to the lower abdomen and back.  A sense of increased pelvic pressure or back  pain.  A watery or bloody mucus discharge that comes from the vagina. If you have any of these signs before the 37th week of pregnancy, call your caregiver right away. You need to go to the hospital to get checked immediately. HOME CARE INSTRUCTIONS   Avoid all smoking, herbs, alcohol, and unprescribed drugs. These chemicals affect the formation and growth of the baby.  Follow your caregiver's instructions regarding medicine use. There are medicines that are either safe or unsafe to take during pregnancy.  Exercise only as directed by your caregiver. Experiencing uterine cramps is a good sign to stop exercising.  Continue to eat regular, healthy meals.  Wear a good support bra for breast tenderness.  Do not use hot tubs, steam rooms, or saunas.  Wear your seat belt at all times when driving.  Avoid raw meat, uncooked cheese, cat litter boxes, and soil used by cats. These carry germs that can cause birth defects in the baby.  Take your prenatal vitamins.  Try taking a stool softener (if your caregiver approves) if you develop constipation. Eat more high-fiber foods, such as fresh vegetables or fruit and whole grains. Drink plenty of fluids to keep your urine clear or pale yellow.  Take warm sitz baths to soothe any pain or discomfort caused by hemorrhoids. Use hemorrhoid cream if your caregiver approves.  If you develop varicose veins, wear support hose. Elevate your feet for 15 minutes, 3-4 times a day. Limit salt in your diet.  Avoid heavy lifting, wear low heal shoes, and practice good posture.  Rest a lot with your legs elevated if you have leg cramps or low back pain.  Visit your dentist if you have not gone during your pregnancy. Use a soft toothbrush to brush your teeth and be gentle when you floss.  A sexual relationship may be continued unless your caregiver directs you otherwise.  Do not travel far distances unless it is absolutely necessary and only with the approval  of your caregiver.  Take prenatal classes to understand, practice, and ask questions about the labor and delivery.  Make a trial run to the hospital.  Pack your hospital bag.  Prepare the baby's nursery.  Continue to go to all your prenatal visits as directed by your caregiver. SEEK MEDICAL CARE IF:  You are unsure if you are in labor or if your water has broken.  You have dizziness.  You have mild pelvic cramps, pelvic pressure, or nagging pain in your abdominal area.  You have persistent nausea, vomiting, or diarrhea.  You have a bad smelling vaginal discharge.  You have pain with urination. SEEK IMMEDIATE MEDICAL CARE IF:   You have a fever.  You are leaking fluid from your vagina.  You have spotting or bleeding from your vagina.  You have severe abdominal cramping or pain.  You have rapid weight loss or gain.  You have shortness of breath with chest pain.  You notice sudden or extreme swelling   of your face, hands, ankles, feet, or legs.  You have not felt your baby move in over an hour.  You have severe headaches that do not go away with medicine.  You have vision changes. Document Released: 04/04/2001 Document Revised: 04/15/2013 Document Reviewed: 06/11/2012 [employer] Patient Information 2015 [employer]. This information is not intended to replace advice given to you by your health care provider. Make sure you discuss any questions you have with your health care provider.  

## 2014-11-30 NOTE — Progress Notes (Signed)
Left book in car but states that BG in range as usual  Subjective:  Carolyn Caldwell is a 34 y.o. [id number] at [redacted]w[redacted]d being seen today for ongoing prenatal care.  Patient reports no complaints.  Contractions: Irregular.  Vag. Bleeding: None. Movement: Present. Denies leaking of fluid.   The following portions of the patient's history were reviewed and updated as appropriate: allergies, current medications, past family history, past medical history, past social history, past surgical history and problem list.   Objective:   Filed Vitals:   11/30/14 0919  BP: 91/52  Pulse: 76  Temp: 98.5 F (36.9 C)  Weight: 149 lb 3.2 oz (67.677 kg)    Fetal Status: Fetal Heart Rate (bpm): 131   Movement: Present     General:  Alert, oriented and cooperative. Patient is in no acute distress.  Skin: Skin is warm and dry. No rash noted.   Cardiovascular: Normal heart rate noted  Respiratory: Normal respiratory effort, no problems with respiration noted  Abdomen: Soft, gravid, appropriate for gestational age. Pain/Pressure: Present     Pelvic: Vag. Bleeding: None     Cervical exam deferred        Extremities: Normal range of motion.     Mental Status: Normal mood and affect. Normal behavior. Normal judgment and thought content.   Urinalysis:      Assessment and Plan:  Pregnancy: [id number] at [redacted]w[redacted]d  1. A2/B Gestational diabetes mellitus, antepartum Diet control  2. Supervision of high risk pregnancy, antepartum, third trimester Good control on diet  Preterm labor symptoms and general obstetric precautions including but not limited to vaginal bleeding, contractions, leaking of fluid and fetal movement were reviewed in detail with the patient. Please refer to After Visit Summary for other counseling recommendations.  2 week f/u  Adam Phenix, MD

## 2014-12-01 ENCOUNTER — Telehealth: Payer: Self-pay | Admitting: General Practice

## 2014-12-01 DIAGNOSIS — O2343 Unspecified infection of urinary tract in pregnancy, third trimester: Secondary | ICD-10-CM

## 2014-12-01 MED ORDER — AMOXICILLIN 500 MG PO CAPS: 500.0000 mg | ORAL_CAPSULE | Freq: Three times a day (TID) | ORAL | Status: DC

## 2014-12-01 NOTE — Telephone Encounter (Signed)
Per Caren Griffins, patient has UTI and needs amoxicillin  TID x10d. Med ordered. Called patient, no answer- left message stating we are calling in regards to your most recent urine culture that shows a urinary tract infection. We have sent an antibiotic to your pharmacy, please go by and pick it up. If you have questions you may call us back at the clinics

## 2014-12-02 ENCOUNTER — Encounter (HOSPITAL_COMMUNITY): Payer: Self-pay

## 2014-12-02 ENCOUNTER — Ambulatory Visit (HOSPITAL_COMMUNITY)
Admission: RE | Admit: 2014-12-02 | Discharge: 2014-12-02 | Disposition: A | Discharge status: Home / Self Care | Payer: Medicaid Other | Source: Ambulatory Visit | Attending: Obstetrics & Gynecology | Admitting: Obstetrics & Gynecology

## 2014-12-02 DIAGNOSIS — O24410 Gestational diabetes mellitus in pregnancy, diet controlled: Secondary | ICD-10-CM | POA: Insufficient documentation

## 2014-12-02 DIAGNOSIS — Z3A31 31 weeks gestation of pregnancy: Secondary | ICD-10-CM | POA: Insufficient documentation

## 2014-12-02 DIAGNOSIS — O26843 Uterine size-date discrepancy, third trimester: Secondary | ICD-10-CM | POA: Diagnosis not present

## 2014-12-04 ENCOUNTER — Other Ambulatory Visit (HOSPITAL_COMMUNITY): Payer: Self-pay | Admitting: Obstetrics & Gynecology

## 2014-12-04 ENCOUNTER — Encounter (HOSPITAL_COMMUNITY): Payer: Self-pay

## 2014-12-04 ENCOUNTER — Telehealth (HOSPITAL_COMMUNITY): Payer: Self-pay | Admitting: MS"

## 2014-12-04 ENCOUNTER — Ambulatory Visit (HOSPITAL_COMMUNITY)
Admission: RE | Admit: 2014-12-04 | Discharge: 2014-12-04 | Disposition: A | Discharge status: Home / Self Care | Payer: Medicaid Other | Source: Ambulatory Visit | Attending: Obstetrics & Gynecology | Admitting: Obstetrics & Gynecology

## 2014-12-04 DIAGNOSIS — O358XX0 Maternal care for other (suspected) fetal abnormality and damage, not applicable or unspecified: Secondary | ICD-10-CM | POA: Diagnosis not present

## 2014-12-04 DIAGNOSIS — O24410 Gestational diabetes mellitus in pregnancy, diet controlled: Secondary | ICD-10-CM | POA: Diagnosis not present

## 2014-12-04 DIAGNOSIS — O26843 Uterine size-date discrepancy, third trimester: Secondary | ICD-10-CM | POA: Diagnosis present

## 2014-12-04 DIAGNOSIS — Z3A31 31 weeks gestation of pregnancy: Secondary | ICD-10-CM | POA: Diagnosis not present

## 2014-12-04 NOTE — Telephone Encounter (Signed)
Called Mrs. Carolyn Caldwell's husband, Carolyn Caldwell, per her previous instructions to discuss her cell free DNA test results.  Mrs. Carolyn Caldwell had Panor[facility] testing through [place].  Testing was offered because of abnormal ultrasound findings.   The patient was identified by name and DOB.  We reviewed that these are within normal limits, showing a less than 1 in 10,000 risk for trisomies 21, 18 and 13, and monosomy X (Turner syndrome).  In addition, the risk for triploidy/vanishing twin and sex chromosome trisomies (47,XXX and 47,XXY) was also low risk.  Analysis for 22q11 deletion syndrome was also low risk (1 in 13,300).  This testing identifies > 99% of pregnancies with trisomy 36, trisomy 58, sex chromosome trisomies (47,XXX and 47,XXY), and triploidy. The detection rate for trisomy 18 is 96%.  The detection rate for monosomy X is ~92%.  The false positive rate is <0.1% for all conditions. Testing was also consistent with female fetal sex. They understand that this testing does not identify all genetic conditions.  All questions were answered to his satisfaction, they were encouraged to call with additional questions or concerns.  Quinn Plowman, MS Certified Genetic Counselor 12/04/2014 1:48 PM

## 2014-12-08 ENCOUNTER — Other Ambulatory Visit (HOSPITAL_COMMUNITY): Payer: Medicaid Other

## 2014-12-08 ENCOUNTER — Ambulatory Visit (HOSPITAL_COMMUNITY): Payer: Medicaid Other | Attending: Obstetrics & Gynecology

## 2014-12-08 DIAGNOSIS — IMO0002 Reserved for concepts with insufficient information to code with codable children: Secondary | ICD-10-CM | POA: Insufficient documentation

## 2014-12-09 ENCOUNTER — Encounter: Payer: Self-pay | Admitting: *Deleted

## 2014-12-09 ENCOUNTER — Inpatient Hospital Stay (HOSPITAL_COMMUNITY): Admission: RE | Admit: 2014-12-09 | Payer: Medicaid Other | Source: Ambulatory Visit

## 2014-12-11 ENCOUNTER — Inpatient Hospital Stay (HOSPITAL_COMMUNITY)
Admission: AD | Admit: 2014-12-11 | Discharge: 2014-12-19 | DRG: 775 | Disposition: A | Discharge status: Home / Self Care | Payer: Medicaid Other | Source: Ambulatory Visit | Attending: Family Medicine | Admitting: Family Medicine

## 2014-12-11 ENCOUNTER — Other Ambulatory Visit (HOSPITAL_COMMUNITY): Payer: Self-pay | Admitting: Maternal and Fetal Medicine

## 2014-12-11 ENCOUNTER — Encounter (HOSPITAL_COMMUNITY): Payer: Self-pay | Admitting: General Practice

## 2014-12-11 ENCOUNTER — Ambulatory Visit (HOSPITAL_COMMUNITY)
Admission: RE | Admit: 2014-12-11 | Discharge: 2014-12-11 | Disposition: A | Discharge status: Home / Self Care | Payer: Medicaid Other | Source: Ambulatory Visit | Attending: Obstetrics & Gynecology | Admitting: Obstetrics & Gynecology

## 2014-12-11 ENCOUNTER — Encounter (HOSPITAL_COMMUNITY): Payer: Self-pay

## 2014-12-11 DIAGNOSIS — O9902 Anemia complicating childbirth: Secondary | ICD-10-CM | POA: Diagnosis present

## 2014-12-11 DIAGNOSIS — Z3A32 32 weeks gestation of pregnancy: Secondary | ICD-10-CM

## 2014-12-11 DIAGNOSIS — D573 Sickle-cell trait: Secondary | ICD-10-CM | POA: Diagnosis present

## 2014-12-11 DIAGNOSIS — IMO0002 Reserved for concepts with insufficient information to code with codable children: Secondary | ICD-10-CM

## 2014-12-11 DIAGNOSIS — O26843 Uterine size-date discrepancy, third trimester: Secondary | ICD-10-CM

## 2014-12-11 DIAGNOSIS — O358XX1 Maternal care for other (suspected) fetal abnormality and damage, fetus 1: Secondary | ICD-10-CM

## 2014-12-11 DIAGNOSIS — O359XX1 Maternal care for (suspected) fetal abnormality and damage, unspecified, fetus 1: Secondary | ICD-10-CM

## 2014-12-11 DIAGNOSIS — O4103X1 Oligohydramnios, third trimester, fetus 1: Secondary | ICD-10-CM

## 2014-12-11 DIAGNOSIS — Z3A33 33 weeks gestation of pregnancy: Secondary | ICD-10-CM

## 2014-12-11 DIAGNOSIS — O24414 Gestational diabetes mellitus in pregnancy, insulin controlled: Secondary | ICD-10-CM | POA: Diagnosis present

## 2014-12-11 DIAGNOSIS — O24410 Gestational diabetes mellitus in pregnancy, diet controlled: Secondary | ICD-10-CM

## 2014-12-11 DIAGNOSIS — O4103X0 Oligohydramnios, third trimester, not applicable or unspecified: Secondary | ICD-10-CM | POA: Diagnosis present

## 2014-12-11 DIAGNOSIS — O24419 Gestational diabetes mellitus in pregnancy, unspecified control: Secondary | ICD-10-CM | POA: Diagnosis not present

## 2014-12-11 DIAGNOSIS — Q897 Multiple congenital malformations, not elsewhere classified: Secondary | ICD-10-CM | POA: Diagnosis not present

## 2014-12-11 DIAGNOSIS — O24429 Gestational diabetes mellitus in childbirth, unspecified control: Secondary | ICD-10-CM | POA: Diagnosis not present

## 2014-12-11 DIAGNOSIS — O35EXX1 Maternal care for other (suspected) fetal abnormality and damage, fetal genitourinary anomalies, fetus 1: Secondary | ICD-10-CM

## 2014-12-11 DIAGNOSIS — O4100X0 Oligohydramnios, unspecified trimester, not applicable or unspecified: Secondary | ICD-10-CM | POA: Diagnosis present

## 2014-12-11 DIAGNOSIS — Z3A34 34 weeks gestation of pregnancy: Secondary | ICD-10-CM | POA: Diagnosis present

## 2014-12-11 LAB — CBC
HCT: 33 % — ABNORMAL LOW (ref 36.0–46.0)
Hemoglobin: 11.4 g/dL — ABNORMAL LOW (ref 12.0–15.0)
MCH: 25.1 pg — AB (ref 26.0–34.0)
MCHC: 34.5 g/dL (ref 30.0–36.0)
MCV: 72.5 fL — ABNORMAL LOW (ref 78.0–100.0)
Platelets: 249 10*3/uL (ref 150–400)
RBC: 4.55 MIL/uL (ref 3.87–5.11)
RDW: 15.8 % — AB (ref 11.5–15.5)
WBC: 5.8 10*3/uL (ref 4.0–10.5)

## 2014-12-11 MED ORDER — BETAMETHASONE SOD PHOS & ACET 6 (3-3) MG/ML IJ SUSP
12.0000 mg | INTRAMUSCULAR | Status: AC
  Administered 2014-12-11 – 2014-12-12 (×2): 12 mg via INTRAMUSCULAR
  Filled 2014-12-11 (×2): qty 2

## 2014-12-11 MED ORDER — LACTATED RINGERS IV SOLN
INTRAVENOUS | Status: DC
  Administered 2014-12-11 – 2014-12-17 (×11): via INTRAVENOUS

## 2014-12-11 MED ORDER — CALCIUM CARBONATE ANTACID 500 MG PO CHEW
2.0000 | CHEWABLE_TABLET | ORAL | Status: DC | PRN
  Administered 2014-12-13: 400 mg via ORAL
  Filled 2014-12-11: qty 2

## 2014-12-11 MED ORDER — ZOLPIDEM TARTRATE 5 MG PO TABS: 5.0000 mg | ORAL_TABLET | Freq: Every evening | ORAL | Status: DC | PRN

## 2014-12-11 MED ORDER — ACETAMINOPHEN 325 MG PO TABS: 650.0000 mg | ORAL_TABLET | ORAL | Status: DC | PRN

## 2014-12-11 MED ORDER — DOCUSATE SODIUM 100 MG PO CAPS
100.0000 mg | ORAL_CAPSULE | Freq: Every day | ORAL | Status: DC
  Administered 2014-12-12 – 2014-12-16 (×5): 100 mg via ORAL
  Filled 2014-12-11 (×5): qty 1

## 2014-12-11 MED ORDER — PRENATAL MULTIVITAMIN CH
1.0000 | ORAL_TABLET | Freq: Every day | ORAL | Status: DC
  Administered 2014-12-12 – 2014-12-16 (×5): 1 via ORAL
  Filled 2014-12-11 (×6): qty 1

## 2014-12-11 NOTE — H&P (Signed)
  Carolyn Caldwell is an 34 y.o. [id number] [redacted]w[redacted]d female.   Chief Complaint: Low fluid HPI: Pt. Admitted from MFM with low fluid.  No single pocket > 1 cm.  She reports excellent fetal movement.  Denies leakage of fluid, vaginal bleeding. She is a diet controlled A2/B diabetes.  She is otherwise feeling well.  Denies fever, chills, nausea, vomiting, or chest pain or shortness of breath.  Past Medical History  Diagnosis Date  . Sickle cell trait   . Anemia     Past Surgical History  Procedure Laterality Date  . No past surgeries      History reviewed. No pertinent family history.  Social History:  reports that she has never smoked. She has never used smokeless tobacco. She reports that she does not drink alcohol or use illicit drugs.  Allergies: No Known Allergies  No current facility-administered medications on file prior to encounter.   Current Outpatient Prescriptions on File Prior to Encounter  Medication Sig Dispense Refill  . Prenatal Vit-Fe Fumarate-FA (PRENATAL MULTIVITAMIN) TABS Take 1 tablet by mouth daily at 12 noon.    Marland Kitchen ACCU-CHEK FASTCLIX LANCETS MISC Inject 1 each into the skin 4 (four) times daily. O24.419 GDM for testing 4 times daily 102 each 12  . glucose blood (ACCU-CHEK SMARTVIEW) test strip DX Gestational Diabetes O24.419 for testing 4 times daily 100 each 12    A comprehensive review of systems was negative.  Blood pressure 111/62, pulse 74, temperature 98.3 F (36.8 C), temperature source Oral, resp. rate 20, height  (1.499 m), weight 154 lb (69.854 kg), last menstrual period 04/23/2014, unknown if currently breastfeeding. BP 111/62 mmHg  Pulse 74  Temp(Src) 98.3 F (36.8 C) (Oral)  Resp 20  Ht  (1.499 m)  Wt 154 lb (69.854 kg)  BMI 31.09 kg/m2  LMP 04/23/2014 General appearance: alert, cooperative and appears stated age Head: Normocephalic, without obvious abnormality, atraumatic, no scalp lesions Neck: supple, symmetrical, trachea  midline Lungs: normal effort Heart: regular rate and rhythm Abdomen: gravid, NT Extremities: extremities normal, atraumatic, no cyanosis or edema Pulses: 2+ and symmetric Skin: Skin color, texture, turgor normal. No rashes or lesions Neurologic: Grossly normal FHR average variability, baseline 130's, + accels, no decels-Category I  Lab Results  Component Value Date   WBC 5.2 11/02/2014   HGB 10.7* 11/02/2014   HCT 31.3* 11/02/2014   MCV 73.3* 11/02/2014   PLT 244 11/02/2014         ABO, Rh: A/Positive/-- (03/24 0000)  Antibody: Negative (03/24 0000)  Rubella:    RPR: NON REAC (07/11 0908)  HBsAg: Negative (03/28 0000)  HIV: NONREACTIVE (07/11 0908)  GBS:      U/s today reveals no pocket > 1 cm, vertex.  Assessment Principal Problem:   Oligohydramnios, antepartum Active Problems:   A2/B Gestational diabetes mellitus, antepartum   Fetal anomaly  Plan Admission with IV hydration to see if this improves amniotic fluid. For repeat US in 2 days BMZ to improve lung maturity Possible delivery at 27 wks--NICU aware of PTB and fetal anomalies which would lead to transfer to [facility] after birth, per [facility] Diabetic diet, check CBG's and monitor for changes related to steroids and worsening control.  Haydn Hutsell S 12/11/2014, 9:08 PM

## 2014-12-12 LAB — GLUCOSE, CAPILLARY
GLUCOSE-CAPILLARY: 117 mg/dL — AB (ref 65–99)
GLUCOSE-CAPILLARY: 122 mg/dL — AB (ref 65–99)
Glucose-Capillary: 128 mg/dL — ABNORMAL HIGH (ref 65–99)
Glucose-Capillary: 131 mg/dL — ABNORMAL HIGH (ref 65–99)

## 2014-12-12 LAB — OB RESULTS CONSOLE GBS: GBS: NEGATIVE

## 2014-12-12 MED ORDER — GLYBURIDE 2.5 MG PO TABS
2.5000 mg | ORAL_TABLET | Freq: Two times a day (BID) | ORAL | Status: DC
  Administered 2014-12-12 – 2014-12-17 (×11): 2.5 mg via ORAL
  Filled 2014-12-12 (×12): qty 1

## 2014-12-12 NOTE — Progress Notes (Signed)
Patient ID: Carolyn Caldwell, female   DOB: 1981-04-16, 34 y.o.   MRN: [id number] FACULTY PRACTICE ANTEPARTUM(COMPREHENSIVE) NOTE  Carolyn Caldwell is a 34 y.o. [id number] at [redacted]w[redacted]d by best clinical estimate who is admitted for oligohydramnios, fetal anomaly with bladder outlet obstruction and DM.   Fetal presentation is cephalic. Length of Stay:  1  Days  Subjective: Feels hungry but otherwise doing ok. Patient reports the fetal movement as active. Patient reports uterine contraction  activity as none. Patient reports  vaginal bleeding as none. Patient describes fluid per vagina as None.  Vitals:  Blood pressure 107/61, pulse 69, temperature 98.2 F (36.8 C), temperature source Oral, resp. rate 18, height  (1.499 m), weight 154 lb (69.854 kg), last menstrual period 04/23/2014, unknown if currently breastfeeding. Physical Examination:  General appearance - alert, well appearing, and in no distress Chest - normal effort Abdomen - gravid, NT Neurological - alert, oriented, normal speech, no focal findings or movement disorder noted Fundal Height:  size equals dates Extremities: Homans sign is negative, no sign of DVT  Membranes:intact  Fetal Monitoring:  Baseline: 130 bpm, Variability: Good {> 6 bpm), Accelerations: Reactive and Decelerations: Absent  Labs:  Results for orders placed or performed during the hospital encounter of 12/11/14 (from the past 24 hour(s))  CBC on admission   Collection Time: 12/11/14  9:15 PM  Result Value Ref Range   WBC 5.8 4.0 - 10.5 K/uL   RBC 4.55 3.87 - 5.11 MIL/uL   Hemoglobin 11.4 (L) 12.0 - 15.0 g/dL   HCT 27.2 (L) 53.6 - 64.4 %   MCV 72.5 (L) 78.0 - 100.0 fL   MCH 25.1 (L) 26.0 - 34.0 pg   MCHC 34.5 30.0 - 36.0 g/dL   RDW 03.4 (H) 74.2 - 59.5 %   Platelets 249 150 - 400 K/uL  Glucose, capillary   Collection Time: 12/12/14 12:01 AM  Result Value Ref Range   Glucose-Capillary 117 (H) 65 - 99 mg/dL  Glucose, capillary   Collection Time:  12/12/14  6:06 AM  Result Value Ref Range   Glucose-Capillary 128 (H) 65 - 99 mg/dL     Medications:  Scheduled . betamethasone acetate-betamethasone sodium phosphate  12 mg Intramuscular Q24 Hr x 2  . docusate sodium  100 mg Oral Daily  . prenatal multivitamin  1 tablet Oral Q1200   I have reviewed the patient's current medications.  ASSESSMENT/PLAN: Oligohydramnios--probably related to bladder obstruction. Continue IV hydration and re-assess tomorrow. DM--worsening glycemic control due to steroid effect, will need to start Glyburide until BS come down.    Reva Bores, MD 12/12/2014,7:08 AM

## 2014-12-13 ENCOUNTER — Inpatient Hospital Stay (HOSPITAL_COMMUNITY): Payer: Medicaid Other

## 2014-12-13 LAB — GLUCOSE, CAPILLARY
GLUCOSE-CAPILLARY: 121 mg/dL — AB (ref 65–99)
GLUCOSE-CAPILLARY: 122 mg/dL — AB (ref 65–99)
Glucose-Capillary: 108 mg/dL — ABNORMAL HIGH (ref 65–99)
Glucose-Capillary: 116 mg/dL — ABNORMAL HIGH (ref 65–99)
Glucose-Capillary: 125 mg/dL — ABNORMAL HIGH (ref 65–99)

## 2014-12-13 LAB — CULTURE, BETA STREP (GROUP B ONLY)

## 2014-12-14 ENCOUNTER — Encounter (HOSPITAL_COMMUNITY): Payer: Self-pay | Admitting: Obstetrics & Gynecology

## 2014-12-14 ENCOUNTER — Encounter: Payer: Medicaid Other | Admitting: Obstetrics and Gynecology

## 2014-12-14 LAB — GLUCOSE, CAPILLARY
GLUCOSE-CAPILLARY: 101 mg/dL — AB (ref 65–99)
Glucose-Capillary: 81 mg/dL (ref 65–99)
Glucose-Capillary: 55 mg/dL — ABNORMAL LOW (ref 65–99)
Glucose-Capillary: 88 mg/dL (ref 65–99)

## 2014-12-14 NOTE — Progress Notes (Signed)
Pt stating that she feels fine and does not feel bad.  Snack given and encouraged pt to eat so that her blood sugar does not continue to fall.

## 2014-12-14 NOTE — Progress Notes (Signed)
Patient ID: Carolyn Caldwell, female   DOB: 26-Feb-1981, 34 y.o.   MRN: [id number] FACULTY PRACTICE ANTEPARTUM(COMPREHENSIVE) NOTE  Carolyn Caldwell is a 34 y.o. [id number] at [redacted]w[redacted]d by best clinical estimate who is admitted for oligohydramnios, fetal anomaly with bladder outlet obstruction and DM.   Fetal presentation is cephalic. Length of Stay:  3  Days  Subjective: Patient is doing well without complaints Patient reports the fetal movement as active. Patient reports uterine contraction  activity as none. Patient reports  vaginal bleeding as none. Patient describes fluid per vagina as None.  Vitals:  Blood pressure 102/60, pulse 73, temperature 98.2 F (36.8 C), temperature source Oral, resp. rate 18, height  (1.499 m), weight 154 lb (69.854 kg), last menstrual period 04/23/2014, unknown if currently breastfeeding. Physical Examination:  General appearance - alert, well appearing, and in no distress Chest - normal effort Abdomen - gravid, NT Neurological - alert, oriented, normal speech, no focal findings or movement disorder noted Fundal Height:  size equals dates Extremities: Homans sign is negative, no sign of DVT  Membranes:intact  Fetal Monitoring:  Baseline: 130 bpm, Variability: Good {> 6 bpm), Accelerations: Reactive and Decelerations: Absent  Labs:  Results for orders placed or performed during the hospital encounter of 12/11/14 (from the past 24 hour(s))  Glucose, capillary   Collection Time: 12/13/14  7:56 AM  Result Value Ref Range   Glucose-Capillary 116 (H) 65 - 99 mg/dL   Comment 1 Notify RN   Glucose, capillary   Collection Time: 12/13/14 10:50 AM  Result Value Ref Range   Glucose-Capillary 121 (H) 65 - 99 mg/dL   Comment 1 Notify RN   Glucose, capillary   Collection Time: 12/13/14  4:28 PM  Result Value Ref Range   Glucose-Capillary 122 (H) 65 - 99 mg/dL   Comment 1 Notify RN   Glucose, capillary   Collection Time: 12/13/14 11:13 PM  Result Value Ref  Range   Glucose-Capillary 125 (H) 65 - 99 mg/dL     Medications:  Scheduled . docusate sodium  100 mg Oral Daily  . glyBURIDE  2.5 mg Oral BID WC  . prenatal multivitamin  1 tablet Oral Q1200   I have reviewed the patient's current medications.  ASSESSMENT/PLAN: 34 yo [id number] at [redacted]w[redacted]d admitted secondary to oligohydramnios 1) Oligohydramnios - likely secondary to bladder outlet obstructions - Patient completed second dose of betamethasone yesterday - Will discuss delivery plan with MFM - last ultrasound 12/13/2014 with final report pending  2) Gestational diabetes - CBGs better controlled on 2.5mg   BID glyburide - Continue current regimen - Continue monitoring CBGs - Continue current antepartum care     Cassidee Deats, MD 12/14/2014,6:41 AM

## 2014-12-15 LAB — GLUCOSE, CAPILLARY
GLUCOSE-CAPILLARY: 63 mg/dL — AB (ref 65–99)
GLUCOSE-CAPILLARY: 86 mg/dL (ref 65–99)
Glucose-Capillary: 80 mg/dL (ref 65–99)
Glucose-Capillary: 111 mg/dL — ABNORMAL HIGH (ref 65–99)

## 2014-12-15 NOTE — Progress Notes (Signed)
Patient ID: Carolyn Caldwell, female   DOB: 1981/03/03, 34 y.o.   MRN: [id number] FACULTY PRACTICE ANTEPARTUM(COMPREHENSIVE) NOTE  Carolyn Caldwell is a 34 y.o. [id number] at [redacted]w[redacted]d by best clinical estimate who is admitted for oligohydramnios, fetal anomaly with bladder outlet obstruction and DM.   Fetal presentation is cephalic. Length of Stay:  4  Days  Subjective: Patient is doing well without complaints.   Patient reports the fetal movement as active. Patient reports uterine contraction  activity as none. Patient reports  vaginal bleeding as none. Patient describes fluid per vagina as None.  Vitals:  Blood pressure 108/65, pulse 65, temperature 97.8 F (36.6 C), temperature source Oral, resp. rate 20, height  (1.499 m), weight 154 lb (69.854 kg), last menstrual period 04/23/2014, unknown if currently breastfeeding. Physical Examination:  General appearance - alert, well appearing, and in no distress Heart: regular rate, no murmur Lungs: clear to auscultation bilaterally, no wheezing.  Abdomen - gravid, NT Neurological - alert, oriented, normal speech, no focal findings or movement disorder noted Fundal Height:  size equals dates Extremities: Homans sign is negative, no sign of DVT  Membranes:intact  Fetal Monitoring:  Baseline: 130 bpm, Variability: Good {> 6 bpm), Accelerations: Reactive and Decelerations: Absent  Labs:  Results for orders placed or performed during the hospital encounter of 12/11/14 (from the past 24 hour(s))  Glucose, capillary   Collection Time: 12/14/14  7:54 AM  Result Value Ref Range   Glucose-Capillary 81 65 - 99 mg/dL   Comment 1 Notify RN    Comment 2 Document in Chart   Glucose, capillary   Collection Time: 12/14/14 10:50 AM  Result Value Ref Range   Glucose-Capillary 55 (L) 65 - 99 mg/dL  Glucose, capillary   Collection Time: 12/14/14  4:04 PM  Result Value Ref Range   Glucose-Capillary 88 65 - 99 mg/dL  Glucose, capillary   Collection  Time: 12/14/14 10:11 PM  Result Value Ref Range   Glucose-Capillary 101 (H) 65 - 99 mg/dL     Medications:  Scheduled . docusate sodium  100 mg Oral Daily  . glyBURIDE  2.5 mg Oral BID WC  . prenatal multivitamin  1 tablet Oral Q1200   I have reviewed the patient's current medications.  ASSESSMENT/PLAN: 34 yo [id number] at [redacted]w[redacted]d admitted secondary to oligohydramnios 1) Oligohydramnios - likely secondary to bladder outlet obstructions - Patient completed second dose of betamethasone yesterday - Discussed patient with Dr Sherrie George, who recommended delivery at 34 weeks, provided NST remains reactive. - last ultrasound 12/13/2014 with final report  2) Gestational diabetes - CBGs better controlled on 2.5mg   BID glyburide - Continue current regimen - Continue monitoring CBGs - Continue current antepartum care  Carolyn Heritage, DO  12/15/2014,6:29 AM

## 2014-12-15 NOTE — Progress Notes (Signed)
Initial Nutrition Assessment  DOCUMENTATION CODES:   Obesity unspecified  INTERVENTION:  Carbohydrate modified gestational diet  NUTRITION DIAGNOSIS:   Increased nutrient needs related to  (pregnancy and fetal growth requirements) as evidenced by  (33-[redacted] weeks pregnant).  GOAL:   Patient will meet greater than or equal to 90% of their needs  MONITOR:   Weight trends  REASON FOR ASSESSMENT:   Antenatal, Gestational Diabetes    ASSESSMENT:  33 4/7 weeks, admitted for oligohydramnios, fetal anomaly with bladder outlet obstruction and DM. Delivery planned for 34 weeks. S/p betamethasone, glyburide added for glucose control. Followed in [facility] where GDM education was completed.    Diet Order:  Diet gestational carb mod Room service appropriate?: Yes; Fluid consistency:: Thin  Height:   Ht Readings from Last 1 Encounters:  12/11/14  (1.499 m)   Weight:   Wt Readings from Last 1 Encounters:  12/11/14 154 lb (69.854 kg)   Ideal Body Weight:   90-100 lbs  BMI:  Body mass index is 31.09 kg/(m^2).  Estimated Nutritional Needs:   Kcal:  1800-2000  Protein:  90-100 g  Fluid:  2.1 L  EDUCATION NEEDS:   No education needs identified at this time (GDM diet education completed in [facility])  Elisabeth Cara M.Odis Luster LDN Neonatal Nutrition Support Specialist/RD III Pager [phone]      Phone [phone]

## 2014-12-15 NOTE — Progress Notes (Signed)
Carolyn Caldwell has many things weighing on her right now.  Her fiance is a caregiver to his father and this is not only affecting his ability to work, but he is also between [facility], caring for her, and [facility] caring for his father.  They are facing a transfer of baby soon after delivery, which would put a strain on FOB who will be juggling 3 hospitals in 3 different cities, as well as 2 older children at home (ages 15 and 61).  Carolyn Caldwell is very concerned about the logistics of how this will all work out and she is also very concerned about how they will pay their bills with him not working and her not working right now.  Her job gives her no maternity leave or any kind of paid leave.  She will be taking off time to care for her baby, especially after the surgery.    Her main concerns at this time are figuring out the logistics of how her baby's birth will go (she would like to know if it will be induction or C/S) and how they will cope after the delivery.  She was not able to talk further about the emotional aspects of her baby's anomalies or his upcoming surgery at this time.    I consulted with her RN to see if we could help her figure out some of the logistics of the birth itself and I consulted with Lulu Riding, LCSW, to help her think through other resources.  I plan to follow up with her tomorrow to check in on how she is doing emotionally and spiritually, but please page sooner if needs arise.  Chaplain Dyanne Carrel, Bcc Pager, [phone] 2:31 PM

## 2014-12-16 LAB — GLUCOSE, CAPILLARY
Glucose-Capillary: 79 mg/dL (ref 65–99)
Glucose-Capillary: 126 mg/dL — ABNORMAL HIGH (ref 65–99)
Glucose-Capillary: 121 mg/dL — ABNORMAL HIGH (ref 65–99)
Glucose-Capillary: 128 mg/dL — ABNORMAL HIGH (ref 65–99)

## 2014-12-16 LAB — TYPE AND SCREEN
ABO/RH(D): A POS
ANTIBODY SCREEN: NEGATIVE

## 2014-12-16 NOTE — Progress Notes (Signed)
CSW received consult from [place].  CSW met with patient in her Antenatal room and spoke to FOB at the same time on speaker phone.  CSW introduced services.  Patient was very quiet, but pleasant.  Her main question at this time is what time she will be induced so that she can ensure FOB will be here.  CSW spoke with RN and then explained to patient that staff will try to start the induction sometime between 7am-12pm, but that it depends on other patients who arrive to deliver throughout the night/morning.  CSW informed patient that staff can tell her in the morning when the induction medication will be started and she can call her husband at that time.  CSW inquired about her two other children, ages 61 and 40, and who they will stay with tomorrow while she is in labor and FOB is here.  She states they will stay home with the grandfather who was discharged from [facility].  FOB states the grandfather is capable of caring for himself without assistance, but MOB added that he will have numerous follow up appointments.  FOB reports that PGM's diagnosis is stomach cancer.  MOB states they have a few friends they can call on to help if needed. CSW informed parents of the possibility of staying at the [facility] in [place] after baby is transferred to [place] if their social situation allows.  CSW will provide further information after baby's birth if parents are interested.  CSW also informed parents of the baby's potential eligibility for Supplemental Security Income due to his medical concerns.  CSW informed parents that they may ask CSW for more information after baby's birth if they are interested in applying.   CSW offered support through conversation and understanding that parents feel overwhelmed at this time.  CSW gave contact information and asked that they call if questions, concerns or needs arise.  Patient and FOB were very appreciative.

## 2014-12-16 NOTE — Progress Notes (Signed)
FACULTY PRACTICE ANTEPARTUM(COMPREHENSIVE) NOTE  Carolyn Caldwell is a 34 y.o. [id number] at [redacted]w[redacted]d by LMP who is admitted for oligohydramnios .   Fetal presentation is cephalic. Length of Stay:  5  Days  Subjective: No complaints  Patient reports the fetal movement as active. Patient reports uterine contraction  activity as none. Patient reports  vaginal bleeding as none. Patient describes fluid per vagina as None.  Vitals:  Blood pressure 118/55, pulse 66, temperature 97.9 F (36.6 C), temperature source Oral, resp. rate 18, height  (1.499 m), weight 69.854 kg (154 lb), last menstrual period 04/23/2014, unknown if currently breastfeeding. Physical Examination:  General appearance - alert, well appearing, and in no distress Heart - normal rate and regular rhythm Abdomen - soft, nontender, nondistended Fundal Height:  size equals dates Cervical Exam: Not evaluated.  Extremities: extremities normal, atraumatic, no cyanosis or edema and Homans sign is negative, no sign of DVT  Membranes:intact  Fetal Monitoring:  Fetal Heart Rate A      Mode  External filed at 12/15/2014 2245    Baseline Rate (A)  140 bpm filed at 12/15/2014 2245    Variability  6-25 BPM filed at 12/15/2014 2245    Accelerations  15 x 15 filed at 12/15/2014 2245    Decelerations  None filed at 12/15/2014         Labs:  Results for orders placed or performed during the hospital encounter of 12/11/14 (from the past 24 hour(s))  Glucose, capillary   Collection Time: 12/15/14 10:59 AM  Result Value Ref Range   Glucose-Capillary 86 65 - 99 mg/dL   Comment 1 Notify RN    Comment 2 Document in Chart   Glucose, capillary   Collection Time: 12/15/14  2:53 PM  Result Value Ref Range   Glucose-Capillary 80 65 - 99 mg/dL   Comment 1 Notify RN    Comment 2 Document in Chart   Glucose, capillary   Collection Time: 12/15/14  8:48 PM  Result Value Ref Range   Glucose-Capillary 111 (H) 65 - 99 mg/dL       Medications:  Scheduled . docusate sodium  100 mg Oral Daily  . glyBURIDE  2.5 mg Oral BID WC  . prenatal multivitamin  1 tablet Oral Q1200   I have reviewed the patient's current medications.  ASSESSMENT: Patient Active Problem List   Diagnosis Date Noted  . Oligohydramnios, antepartum 12/11/2014  . Fetal anomaly 12/08/2014  . Supervision of high risk pregnancy, antepartum 08/10/2014  . A2/B Gestational diabetes mellitus, antepartum 08/10/2014  . History of gestational diabetes in prior pregnancy, currently pregnant 08/10/2014    PLAN: Plan IOL at 34 weeks due to oligohydramnios  Brighid Koch 12/16/2014,7:33 AM

## 2014-12-17 ENCOUNTER — Ambulatory Visit (HOSPITAL_COMMUNITY): Admission: RE | Admit: 2014-12-17 | Payer: Medicaid Other | Source: Ambulatory Visit

## 2014-12-17 ENCOUNTER — Encounter (HOSPITAL_COMMUNITY): Payer: Self-pay | Admitting: Obstetrics

## 2014-12-17 DIAGNOSIS — O24429 Gestational diabetes mellitus in childbirth, unspecified control: Secondary | ICD-10-CM

## 2014-12-17 DIAGNOSIS — O4103X0 Oligohydramnios, third trimester, not applicable or unspecified: Secondary | ICD-10-CM

## 2014-12-17 DIAGNOSIS — Z3A34 34 weeks gestation of pregnancy: Secondary | ICD-10-CM

## 2014-12-17 LAB — GLUCOSE, CAPILLARY
GLUCOSE-CAPILLARY: 85 mg/dL (ref 65–99)
Glucose-Capillary: 138 mg/dL — ABNORMAL HIGH (ref 65–99)

## 2014-12-17 LAB — CBC
HEMATOCRIT: 31.9 % — AB (ref 36.0–46.0)
HEMOGLOBIN: 10.7 g/dL — AB (ref 12.0–15.0)
MCH: 24.8 pg — AB (ref 26.0–34.0)
MCHC: 33.5 g/dL (ref 30.0–36.0)
MCV: 74 fL — AB (ref 78.0–100.0)
Platelets: 205 10*3/uL (ref 150–400)
RBC: 4.31 MIL/uL (ref 3.87–5.11)
RDW: 16.2 % — AB (ref 11.5–15.5)
WBC: 5.7 10*3/uL (ref 4.0–10.5)

## 2014-12-17 MED ORDER — OXYCODONE-ACETAMINOPHEN 5-325 MG PO TABS
1.0000 | ORAL_TABLET | ORAL | Status: DC | PRN
  Administered 2014-12-17: 1 via ORAL
  Filled 2014-12-17: qty 1

## 2014-12-17 MED ORDER — LIDOCAINE HCL (PF) 1 % IJ SOLN
30.0000 mL | INTRAMUSCULAR | Status: DC | PRN
  Filled 2014-12-17: qty 30

## 2014-12-17 MED ORDER — ONDANSETRON HCL 4 MG/2ML IJ SOLN: 4.0000 mg | INTRAMUSCULAR | Status: DC | PRN

## 2014-12-17 MED ORDER — CITRIC ACID-SODIUM CITRATE 334-500 MG/5ML PO SOLN: 30.0000 mL | ORAL | Status: DC | PRN

## 2014-12-17 MED ORDER — LACTATED RINGERS IV SOLN
500.0000 mL | INTRAVENOUS | Status: DC | PRN
  Administered 2014-12-17: 500 mL via INTRAVENOUS

## 2014-12-17 MED ORDER — OXYCODONE-ACETAMINOPHEN 5-325 MG PO TABS: 2.0000 | ORAL_TABLET | ORAL | Status: DC | PRN

## 2014-12-17 MED ORDER — DIBUCAINE 1 % RE OINT: 1.0000 "application " | TOPICAL_OINTMENT | RECTAL | Status: DC | PRN

## 2014-12-17 MED ORDER — LANOLIN HYDROUS EX OINT: TOPICAL_OINTMENT | CUTANEOUS | Status: DC | PRN

## 2014-12-17 MED ORDER — SENNOSIDES-DOCUSATE SODIUM 8.6-50 MG PO TABS
2.0000 | ORAL_TABLET | ORAL | Status: DC
  Administered 2014-12-17 – 2014-12-18 (×2): 2 via ORAL
  Filled 2014-12-17 (×2): qty 2

## 2014-12-17 MED ORDER — TETANUS-DIPHTH-ACELL PERTUSSIS 5-2.5-18.5 LF-MCG/0.5 IM SUSP: 0.5000 mL | Freq: Once | INTRAMUSCULAR | Status: DC

## 2014-12-17 MED ORDER — LACTATED RINGERS IV SOLN
INTRAVENOUS | Status: DC
  Administered 2014-12-17: 15:00:00 via INTRAUTERINE

## 2014-12-17 MED ORDER — TERBUTALINE SULFATE 1 MG/ML IJ SOLN: 0.2500 mg | Freq: Once | INTRAMUSCULAR | Status: DC | PRN

## 2014-12-17 MED ORDER — SIMETHICONE 80 MG PO CHEW
80.0000 mg | CHEWABLE_TABLET | ORAL | Status: DC | PRN
  Administered 2014-12-18: 80 mg via ORAL
  Filled 2014-12-17: qty 1

## 2014-12-17 MED ORDER — OXYTOCIN BOLUS FROM INFUSION
500.0000 mL | INTRAVENOUS | Status: DC
  Administered 2014-12-17: 500 mL via INTRAVENOUS

## 2014-12-17 MED ORDER — WITCH HAZEL-GLYCERIN EX PADS: 1.0000 "application " | MEDICATED_PAD | CUTANEOUS | Status: DC | PRN

## 2014-12-17 MED ORDER — FENTANYL CITRATE (PF) 100 MCG/2ML IJ SOLN
100.0000 ug | INTRAMUSCULAR | Status: DC | PRN
  Administered 2014-12-17: 100 ug via INTRAVENOUS
  Filled 2014-12-17: qty 2

## 2014-12-17 MED ORDER — ACETAMINOPHEN 325 MG PO TABS: 650.0000 mg | ORAL_TABLET | ORAL | Status: DC | PRN

## 2014-12-17 MED ORDER — OXYTOCIN 40 UNITS IN LACTATED RINGERS INFUSION - SIMPLE MED: 62.5000 mL/h | INTRAVENOUS | Status: DC

## 2014-12-17 MED ORDER — ZOLPIDEM TARTRATE 5 MG PO TABS: 5.0000 mg | ORAL_TABLET | Freq: Every evening | ORAL | Status: DC | PRN

## 2014-12-17 MED ORDER — ONDANSETRON HCL 4 MG/2ML IJ SOLN: 4.0000 mg | Freq: Four times a day (QID) | INTRAMUSCULAR | Status: DC | PRN

## 2014-12-17 MED ORDER — OXYCODONE-ACETAMINOPHEN 5-325 MG PO TABS: 1.0000 | ORAL_TABLET | ORAL | Status: DC | PRN

## 2014-12-17 MED ORDER — BENZOCAINE-MENTHOL 20-0.5 % EX AERO
1.0000 "application " | INHALATION_SPRAY | CUTANEOUS | Status: DC | PRN
  Administered 2014-12-17: 1 via TOPICAL
  Filled 2014-12-17: qty 56

## 2014-12-17 MED ORDER — ONDANSETRON HCL 4 MG PO TABS: 4.0000 mg | ORAL_TABLET | ORAL | Status: DC | PRN

## 2014-12-17 MED ORDER — OXYTOCIN 40 UNITS IN LACTATED RINGERS INFUSION - SIMPLE MED
1.0000 m[IU]/min | INTRAVENOUS | Status: DC
  Administered 2014-12-17: 2 m[IU]/min via INTRAVENOUS
  Filled 2014-12-17: qty 1000

## 2014-12-17 MED ORDER — LACTATED RINGERS IV SOLN
INTRAVENOUS | Status: DC
  Administered 2014-12-17 (×2): via INTRAVENOUS

## 2014-12-17 MED ORDER — DIPHENHYDRAMINE HCL 25 MG PO CAPS: 25.0000 mg | ORAL_CAPSULE | Freq: Four times a day (QID) | ORAL | Status: DC | PRN

## 2014-12-17 MED ORDER — IBUPROFEN 600 MG PO TABS
600.0000 mg | ORAL_TABLET | Freq: Four times a day (QID) | ORAL | Status: DC
  Administered 2014-12-17 – 2014-12-19 (×7): 600 mg via ORAL
  Filled 2014-12-17 (×7): qty 1

## 2014-12-17 MED ORDER — PRENATAL MULTIVITAMIN CH
1.0000 | ORAL_TABLET | Freq: Every day | ORAL | Status: DC
  Administered 2014-12-18: 1 via ORAL
  Filled 2014-12-17: qty 1

## 2014-12-17 NOTE — Progress Notes (Addendum)
Checked in with pt, whose labor had progressed.  She was visibly more uncomfortable and requested help making the room cooler.  Lowered the temperature in her room (with RN permission) and assisted her in getting a cool cloth.    Spoke with patient's spouse who was present at the time but initially on the phone.  He shared his concerns about his father and his fear of not being able to manage being in 3 places at once (home with older children starting school, with his father at [facility] treatment, and with the baby at [facility]).  He stated he was attempting to complete paperwork and hopeful that they might be able to tap into a resource for home health for his father.    Continued to offer emotional support to the family.  FOB had to leave to get something to eat for the children at home and RN had some concern that he might not get back in time for delivery.  Asked patient if she would like me to stay and reminded her that she was welcome to call if she needed additional support.   Maryanna Shape. Carley Hammed, M.Div. [facility] Chaplain [phone]

## 2014-12-17 NOTE — Progress Notes (Signed)
Patient ID: Carolyn Caldwell, female   DOB: Oct 04, 1980, 34 y.o.   MRN: [id number] Doing well. I started her on Pitocin earlier  Filed Vitals:   12/17/14 0720 12/17/14 0937 12/17/14 1033 12/17/14 1106  BP: 107/65 103/54 100/59 100/54  Pulse: 68 76 74 79  Temp: 98.3 F (36.8 C) 98 F (36.7 C)    TempSrc: Oral Oral    Resp: Height:      Weight:       Starting to have some pain with contractions FHR reactive with some variable/early decels  Dilation: 2 Effacement (%): 80 Station: -3, -2 Presentation: Vertex Exam by:: m Kalise Fickett cnm   Foley inserted easily  Anticipated quick delivery

## 2014-12-17 NOTE — Progress Notes (Addendum)
Was notified by Kathleen Argue that pt would be induced today and would need follow up.  Pt's father in-law has been hospitalized at [facility] for treatment of stomach cancer, which places additional stress on the family-particularly FOB who is between locations caring for his father and older children and supporting his wife.  During our initial visit, the father was at home with his father and children.  This Clinical research associate offered emotional support to Qu[place]ia who was hesitant to go into detail about her feelings about her baby's anomolies.  She did say that she is just hoping for a safe delivery and that she is worried about the transfer to [facility], but has some hope because the doctor told her this morning that that decision would be made after the baby was born, so there is some possibility of him staying in the N[facility].  We discussed the stress of feeling pulled in many directions.  Mother was laboring while I was present and had elected not to receive an epidural.  She stated that she had not had one with her other children.  During the visit, a phlebotomist came to draw blood and had some difficulty.  This Clinical research associate offered emotional support and a hand to hold while the patient was visibly uncomfortable from contractions and the needle stick.  She became tearful during the process.  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  She requested prayer and we prayed for comfort during delivery, health of the baby, and support for all that the family has to balance.  Spoke to patient's husband briefly on the phone and he requested I put my direct contact information on the patient's board.  Notified the family that I am not always at my desk, but left my name on the board and notified them that nursing staff knows how to page me.    Discussed  coping techniques that could be used during labor and practiced some deep breathing with pt.  Reminded mother that I am available if she would like additional support while laboring alone.  WIll continue to follow.  Maryanna Shape. Carley Hammed, M.Div. [facility] Chaplain [phone]    12/17/14 1100  Clinical Encounter Type  Visited With Patient  Visit Type Spiritual support  Referral From Chaplain  Recommendations Continued Follow  Spiritual Encounters  Spiritual Needs Prayer;Emotional;Other (Comment)  Stress Factors  Patient Stress Factors Family relationships;Financial concerns;Health changes;Major life changes  Family Stress Factors Health changes;Lack of caregivers  Advance Directives (For Healthcare)  Does patient have an advance directive? No

## 2014-12-17 NOTE — Progress Notes (Signed)
Patient ID: Carolyn Caldwell, female   DOB: 14-May-1980, 35 y.o.   MRN: [id number] AROM for no fluid  IUPC inserted. Bloody return. Flushed and working well  Amnioinfusion started

## 2014-12-18 LAB — RPR: RPR: NONREACTIVE

## 2014-12-18 MED ORDER — INFLUENZA VAC SPLIT QUAD 0.5 ML IM SUSY
0.5000 mL | PREFILLED_SYRINGE | INTRAMUSCULAR | Status: AC
Start: 2014-12-19 — End: 2014-12-19
  Administered 2014-12-19: 0.5 mL via INTRAMUSCULAR
  Filled 2014-12-18: qty 0.5

## 2014-12-18 NOTE — Progress Notes (Signed)
CSW met with parents in MOB's third floor room to follow up on initial meeting while MOB was a patient on the Antenatal unit to offer support and assistance.  Parents report that they are doing well and appear to be much more at ease than upon initial meeting prior to baby's birth.  FOB reports that "things are going in the right direction."  They seem relieved that baby was not urgently transferred to another hospital after birth, as they has been prepared.  They state they do not know what questions to ask since they have never been in this type of situation before, but report feeling comfortable with his care and feel they are being updated appropriately by staff.   MOB reports being out of work for as long as needed.  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  She states that her job will be there when she is ready to return, but that there is no paid leave.  FOB works for [employer].   In addition to having a baby in the NICU, family is dealing with the stress of PGF's illness with stomach cancer.  FOB takes him to his Chemo appointments.  He was recently discharged from [facility] last week.  CSW inquired about preparations for baby at home.  Parents report having a crib for baby.  Safe sleep reviewed.  CSW offered baby basic supplies from [employer].  Parents accepted and were very appreciative.  CSW will make referral.   CSW provided education on signs and symptoms of perinatal mood disorders.  CSW encouraged MOB to talk with CSW and or her doctor if she has concerns at any time.  She denies any current symptoms or past hx after her other births.   CSW explained ongoing support services offered by NICU CSW and gave contact information.   Parents reports pediatric care will be with Dr. Berline Lopes at [facility].

## 2014-12-18 NOTE — Lactation Note (Signed)
This note was copied from the chart of Carolyn Caldwell. Lactation Consultation Note  Patient Name: Carolyn Finlay Mills [id number] Date: 12/18/2014 Reason for consult: Initial assessment;NICU baby;Infant < 6lbs;Late preterm infant   With this mom of a NICU baby, now 65 hours old. MOm has a DEP, and did not pump through the night. I explained supply and demand to mom, and the importeance of pumping every 2-3 hours, at least 8 times a day. Mom pumped this morning, and expressed a few mls of colostrum. Basic pumping teaching, and how skin to skin and nuzzling when able is importance for mom's milk supply and baby's stress levels. Mom know to call for questions/concerns.    Maternal Data    Feeding Feeding Type: Breast Milk with Formula added  LATCH Score/Interventions                      Lactation Tools Discussed/Used WIC Program: Yes [facility] fax sent for DEP  [place]) Pump Review: Setup, frequency, and cleaning;Milk Storage;Other (comment) (Hna dexpression, premie setting. review of nICU booklet) Initiated by:: bedside RN Date initiated:: 12/17/14   Consult Status Consult Status: Follow-up Date: 12/19/14 Follow-up type: In-patient    Alfred Levins 12/18/2014, 1:55 PM

## 2014-12-18 NOTE — Progress Notes (Signed)
Post Partum Day 1 Subjective: no complaints, up ad lib, voiding and tolerating PO  Objective: Blood pressure 96/51, pulse 68, temperature 98.7 F (37.1 C), temperature source Oral, resp. rate 18, height  (1.499 m), weight 154 lb (69.854 kg), last menstrual period 04/23/2014, SpO2 100 %, unknown if currently breastfeeding.  Physical Exam:  General: alert, cooperative and no distress Lochia: appropriate Uterine Fundus: firm DVT Evaluation: No evidence of DVT seen on physical exam. Negative Homan's sign. No cords or calf tenderness.   Recent Labs  12/17/14 1136  HGB 10.7*  HCT 31.9*    Assessment/Plan: Plan for discharge tomorrow, Breastfeeding and Lactation consult   LOS: 7 days   Carolyn Caldwell JEHIEL 12/18/2014, 7:26 AM

## 2014-12-19 MED ORDER — IBUPROFEN 600 MG PO TABS: 600.0000 mg | ORAL_TABLET | Freq: Four times a day (QID) | ORAL | Status: AC

## 2014-12-19 MED ORDER — TRAMADOL HCL 50 MG PO TABS: 50.0000 mg | ORAL_TABLET | Freq: Four times a day (QID) | ORAL | Status: AC | PRN

## 2014-12-19 NOTE — Progress Notes (Signed)
D/C instructions and prescriptions reviewed with pt. Pt d/c home with family, ambulatory stable to home via private car.

## 2014-12-19 NOTE — Discharge Summary (Signed)
OB Discharge Summary  Patient Name: Carolyn Caldwell DOB: 04/10/1981 MRN: [id number]  Date of admission: 12/11/2014 Delivering MD: Carolyn Caldwell   Date of discharge: No discharge date for patient encounter.  Admitting diagnosis: Induction of labor    Intrauterine pregnancy: [redacted]w[redacted]d     Secondary diagnosis: oligohydramnios due to fetal anomaly, posterior valves     Discharge diagnosis: Preterm Pregnancy Delivered and A2 DM, oligohydramnios  Method of delivery:      Information for the patient's newborn:  Carolyn, Caldwell [[id number]]  Delivery Method: Vaginal, Spontaneous Delivery (Filed from Delivery Summary)                                                                                                     Post partum procedures:  Augmentation: Pitocin, Cytotec and Foley Balloon  Complications:None  Hospital course:  Induction of Labor With Vaginal Delivery   34 y.o. yo [id number] at [redacted]w[redacted]d was admitted to the hospital 12/11/2014 for induction of labor. Carolyn Caldwell is an 34 y.o. [id number] [redacted]w[redacted]d female.  Chief Complaint: Low fluid HPI: Pt. Admitted from MFM with low fluid. No single pocket > 1 cm. She reports excellent fetal movement. Denies leakage of fluid, vaginal bleeding. She is a diet controlled A2/B diabetes. She is otherwise feeling well. Denies fever, chills, nausea, vomiting, or chest pain or shortness of breath.  Past Medical History  Diagnosis Date  . Sickle cell trait   . Anemia            Indication for induction: A2 DM and oligohydramnios due to bladder outlet obstruction.  Patient had an uncomplicated labor course as follows:  Mediations and procedures used include: Foley Bulb, Cytotec and Pitocin      Length of stage 3:       Carolyn Saa, MD Resident [facility] Needed Family Medicine L&D Delivery Note 12/17/2014 3:33 PM    Expand All Collapse All   Patient is 34 y.o. [id number] [redacted]w[redacted]d admitted for IOL for  oligohydramnios 2/2 fetal bladder outlet obstruction, hx of diet-controlled A2/B diabetes.    Delivery Note At 3:13 PM a viable female was delivered via SVD (Presentation: direct occiput anterior). APGAR: 7, pending (baby in NICU); weight pending.  Placenta status: Intact, Spontaneous. Cord: 3 vessels with the following complications: nuchal cord, delivered through.   Anesthesia: None Episiotomy: None Lacerations: None Est. Blood Loss (mL): 150   Mom to postpartum. Baby to NICU.  Carolyn Caldwell 12/17/2014, 3:33 PM   Upon arrival patient was complete and pushing. She pushed with good maternal effort to deliver a healthy baby female. Baby delivered without difficulty, was noted to have good tone and place on maternal abdomen for oral suctioning, drying and stimulation. Delayed cord clamping performed. Placenta delivered intact with 3V cord. Vaginal canal and perineum was inspected; no lacerations. Pitocin was started and uterus massaged until bleeding slowed. Counts of sharps, instruments, and lap pads were all correct.   Carolyn Abernethy, MD PGY-1 [facility]  Patient had delivery of a Viable infant.  Information for the patient's newborn:  Carolyn, Caldwell [[id number]]  Delivery Method: Vaginal, Spontaneous Delivery (Filed from Delivery Summary)   12/17/2014  Details of delivery can be found in separate delivery note.  Patient had a routine postpartum course. Patient is discharged home No discharge date for patient encounter.    Physical exam  Filed Vitals:   12/18/14 1200 12/18/14 1830 12/18/14 2149 12/19/14 0524  BP: 130/78 100/52 108/55 116/69  Pulse: 83 99 72 91  Temp: 97.8 F (36.6 C) 98.7 F (37.1 C) 98.6 F (37 C) 98.7 F (37.1 C)  TempSrc: Oral Oral Oral Oral  Resp: 18 16 15 15   Height:      Weight:      SpO2:  100% 100% 100%   General: alert, cooperative and no distress Lochia:  appropriate Uterine Fundus: firm Incision:  DVT Evaluation: No evidence of DVT seen on physical exam. Labs: Lab Results  Component Value Date   WBC 5.7 12/17/2014   HGB 10.7* 12/17/2014   HCT 31.9* 12/17/2014   MCV 74.0* 12/17/2014   PLT 205 12/17/2014   CMP Latest Ref Rng 08/17/2014  Glucose 70 - 99 mg/dL 94  BUN 6 - 23 mg/dL 9  Creatinine 4.09 - 8.11 mg/dL 9.14  Sodium 782 - 956 mEq/L 135  Potassium 3.5 - 5.3 mEq/L 4.4  Chloride 96 - 112 mEq/L 102  CO2 19 - 32 mEq/L 25  Calcium 8.4 - 10.5 mg/dL 9.4  Total Protein 6.0 - 8.3 g/dL 6.8  Total Bilirubin 0.2 - 1.2 mg/dL 0.4  Alkaline Phos 39 - 117 U/L 31(L)  AST 0 - 37 U/L 12  ALT 0 - 35 U/L 9    Discharge instruction: per After Visit Summary and "Baby and Me Booklet".  Medications:   Medication List    STOP taking these medications        glucose blood test strip  Commonly known as:  ACCU-CHEK SMARTVIEW      TAKE these medications        ACCU-CHEK FASTCLIX LANCETS Misc  Inject 1 each into the skin 4 (four) times daily. O24.419 GDM for testing 4 times daily     ibuprofen 600 MG tablet  Commonly known as:  ADVIL,MOTRIN  Take 1 tablet (600 mg total) by mouth every 6 (six) hours.     prenatal multivitamin Tabs tablet  Take 1 tablet by mouth daily at 12 noon.     traMADol 50 MG tablet  Commonly known as:  ULTRAM  Take 1 tablet (50 mg total) by mouth every 6 (six) hours as needed for moderate pain or severe pain.        Diet: routine diet  Activity: Advance as tolerated. Pelvic rest for 6 weeks.   Outpatient follow up:6 weeks  Postpartum contraception: Undecided  Newborn Data: Live born female  Birth Weight: 4 lb 15.4 oz (2250 g) APGAR: 7, 8  Baby Feeding: Bottle Disposition:baby to be transferred to [facility] for followup of bladder outlet obstruction, now voiding well with pediatric catheter in place   12/19/2014 Carolyn Burrow, MD

## 2014-12-24 ENCOUNTER — Ambulatory Visit (HOSPITAL_COMMUNITY): Payer: Medicaid Other

## 2015-01-21 ENCOUNTER — Ambulatory Visit: Payer: Medicaid Other | Admitting: Medical

## 2015-02-08 ENCOUNTER — Encounter: Payer: Self-pay | Admitting: Obstetrics and Gynecology

## 2015-02-08 ENCOUNTER — Ambulatory Visit (INDEPENDENT_AMBULATORY_CARE_PROVIDER_SITE_OTHER): Payer: Medicaid Other | Admitting: Obstetrics and Gynecology

## 2015-02-08 DIAGNOSIS — Z30017 Encounter for initial prescription of implantable subdermal contraceptive: Secondary | ICD-10-CM | POA: Diagnosis not present

## 2015-02-08 DIAGNOSIS — Z309 Encounter for contraceptive management, unspecified: Secondary | ICD-10-CM | POA: Insufficient documentation

## 2015-02-08 DIAGNOSIS — Z3202 Encounter for pregnancy test, result negative: Secondary | ICD-10-CM

## 2015-02-08 LAB — POCT PREGNANCY, URINE: Preg Test, Ur: NEGATIVE

## 2015-02-08 MED ORDER — ETONOGESTREL 68 MG ~~LOC~~ IMPL
68.0000 mg | DRUG_IMPLANT | Freq: Once | SUBCUTANEOUS | Status: AC
  Administered 2015-02-08: 68 mg via SUBCUTANEOUS

## 2015-02-08 MED ORDER — ETONOGESTREL 68 MG ~~LOC~~ IMPL: 1.0000 | DRUG_IMPLANT | Freq: Once | SUBCUTANEOUS | Status: DC

## 2015-02-08 NOTE — Progress Notes (Signed)
Patient ID: Carolyn Caldwell, female   DOB: 03/26/1981, 34 y.o.   MRN: [id number] Subjective:     Carolyn Caldwell is a 34 y.o. female who presents for a postpartum visit. She is 7 weeks postpartum following a spontaneous vaginal delivery. I have fully reviewed the prenatal and intrapartum course. The delivery was at 34 gestational weeks. Was admitted for oligohydramnios 2/2 fetal bladder outlet obstructin (posterior uretheral valves). Was induced at 34 weeks. Outcome: induced vaginal delivery. Delivered 8/25.  Anesthesia: none. No lacerations. Postpartum course has been unremarkable. Baby's course has been complicated by a stay in the NICU but is now home. Baby is feeding by both breast and bottle - Similac Isomil. Bleeding staining only. Bowel function is normal. Bladder function is normal. Patient is not sexually active. Contraception method is none. First period last week, almost over. Postpartum depression screening: negative. No history liver disease, venous thrombus, or cancer.  Review of Systems Pertinent items noted in HPI and remainder of comprehensive ROS otherwise negative.   Objective:    BP 96/54 mmHg  Pulse 61  Temp(Src) 98.6 F (37 C)  Wt 152 lb 11.2 oz (69.264 kg)  LMP 02/05/2015  Breastfeeding? Yes  General:  alert, cooperative and appears stated age     Lungs: clear to auscultation bilaterally  Heart:  regular rate and rhythm, S1, S2 normal, no murmur, click, rub or gallop  Abdomen: soft, non-tender; bowel sounds normal; no masses,  no organomegaly   PRE-OP DIAGNOSIS: desired long-term, reversible contraception   POST-OP DIAGNOSIS: Same   PROCEDURE: Nexplanon  placement  Performing Physician: Shonna ChockNoah Wouk, MD   PROCEDURE:  Urine pregnancy test: negative Written informed consent obtained Site (check): left arm        Sterile Preparation:    [x_]      Betadine        [_]     Chloraprep             After a time-out, insertion site was selected 6 - 10 cm from medial  epicondyle and marked. Procedure area was prepped and draped in a sterile fashion. 2 mL of 1% lidocaine w/o epinephrine was used for subcutaneous anesthesia. Anesthesia confirmed.  Nexplanon  trocar was inserted subcutaneously and then Nexplanon  capsule delivered subcutaneously. Trocar was removed from the insertion site. Nexplanon  capsule was palpated by provider and patient to assure satisfactory placement.  Estimated blood loss: minimal Dressings applied: pressure Followup: The patient tolerated the procedure well without complications.  Standard post-procedure care wass explained and return precautions were given.       Assessment/Plan   Normal postpartum exam. Pap smear wnl 06/2014. Nexplanon inserted today. phq9 zero today. No breastfeeding problems. Will need to f/u soon for 2-hour GTT given a1/bdm.

## 2015-02-08 NOTE — Addendum Note (Signed)
Addended by: Garret ReddishBARNES, Takesha Steger M on: 02/08/2015 04:57 PM   Modules accepted: Orders, Medications

## 2015-02-09 ENCOUNTER — Other Ambulatory Visit: Payer: Medicaid Other

## 2015-02-09 DIAGNOSIS — Z8632 Personal history of gestational diabetes: Secondary | ICD-10-CM

## 2015-02-15 ENCOUNTER — Encounter: Payer: Self-pay | Admitting: *Deleted

## 2015-02-16 ENCOUNTER — Other Ambulatory Visit: Payer: Medicaid Other

## 2015-02-16 DIAGNOSIS — O24419 Gestational diabetes mellitus in pregnancy, unspecified control: Secondary | ICD-10-CM

## 2015-02-16 LAB — GLUCOSE TOLERANCE, 2 HOURS

## 2015-02-17 LAB — GLUCOSE TOLERANCE, 2 HOURS
GLUCOSE, 2 HOUR: 111 mg/dL (ref 70–139)
GLUCOSE, FASTING: 86 mg/dL (ref 65–99)

## 2015-02-25 ENCOUNTER — Encounter: Payer: Self-pay | Admitting: *Deleted

## 2015-03-01 ENCOUNTER — Telehealth: Payer: Self-pay | Admitting: General Practice

## 2015-03-01 NOTE — Telephone Encounter (Signed)
Per Dr Debroah LoopArnold, patient's 2 hr was negative. Called patient with pacific interpreter and patient states she doesn't need an interpreter, she speaks english. Informed patient of negative results. Patient verbalized understanding and had no questions. Demographics updated

## 2016-06-04 IMAGING — US US MFM OB LIMITED
1 series · 13 of 28 positions shown · non-contrast
Comparison: none

[Series 1: us mfm ob limited · 0.35mm/px · 13 of 35 slices shown]
[im 2/35]
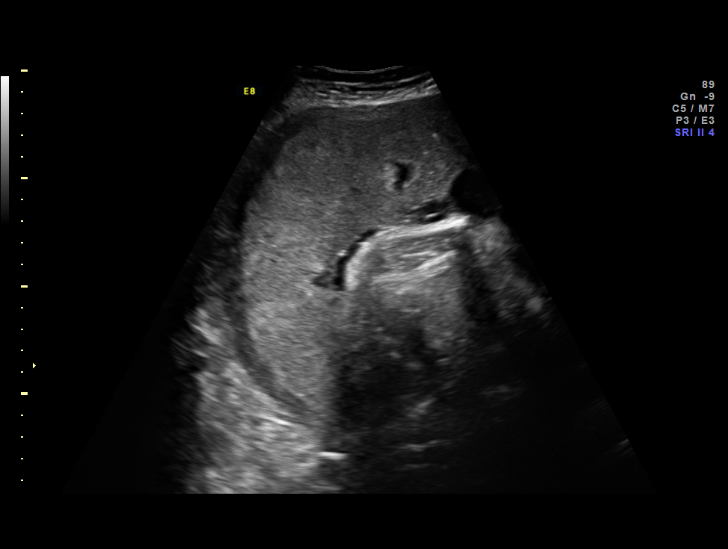
[im 4/35]
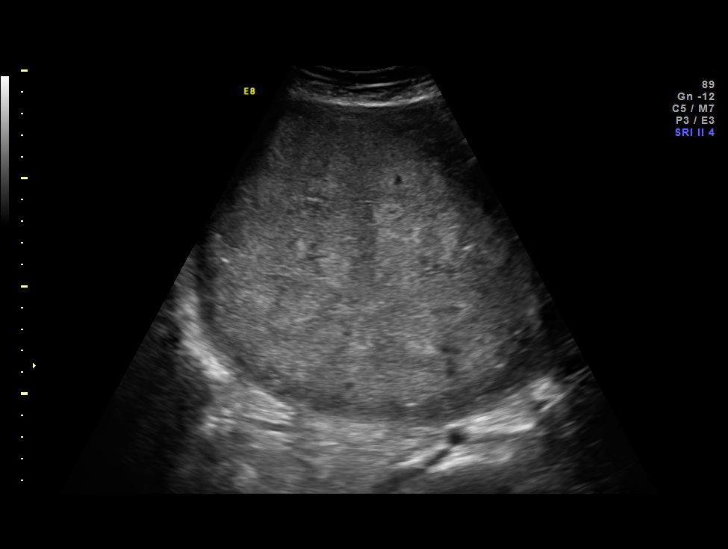
[im 7/35]
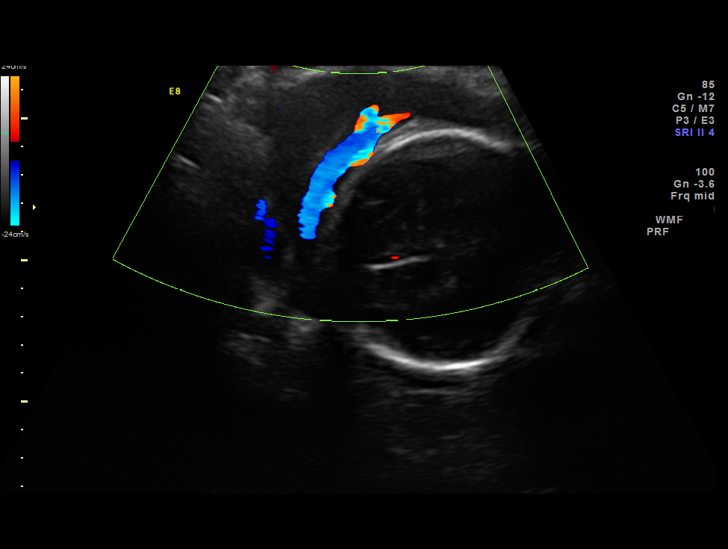
[im 9/35]
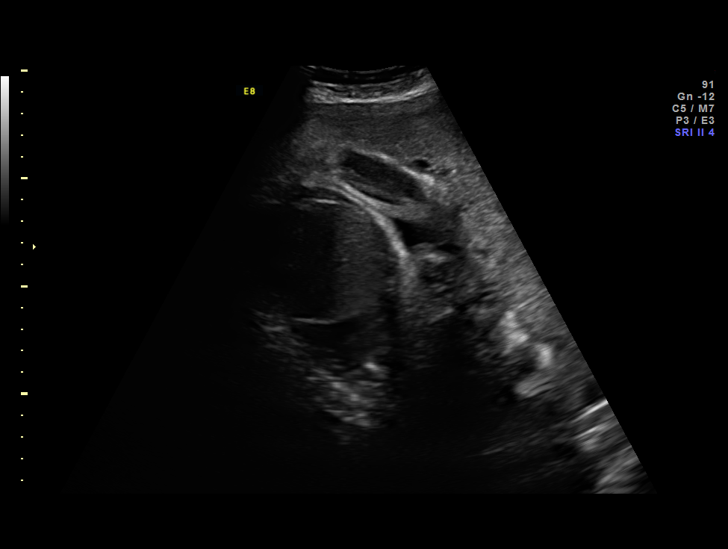
[im 12/35]
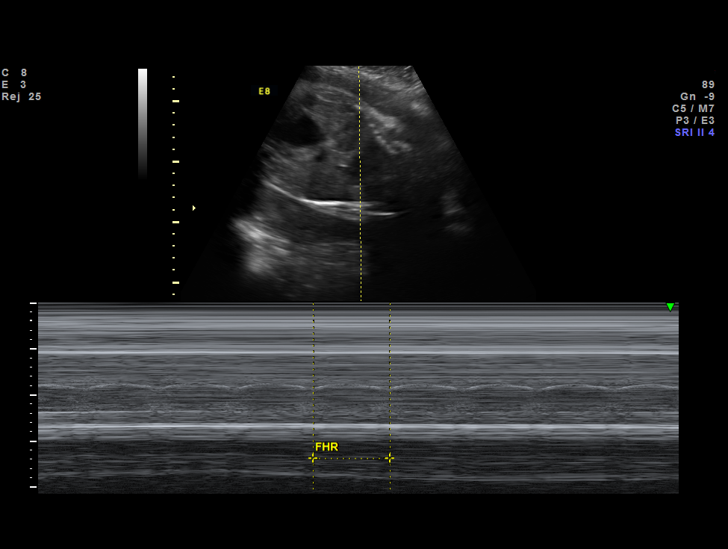
[im 14/35]
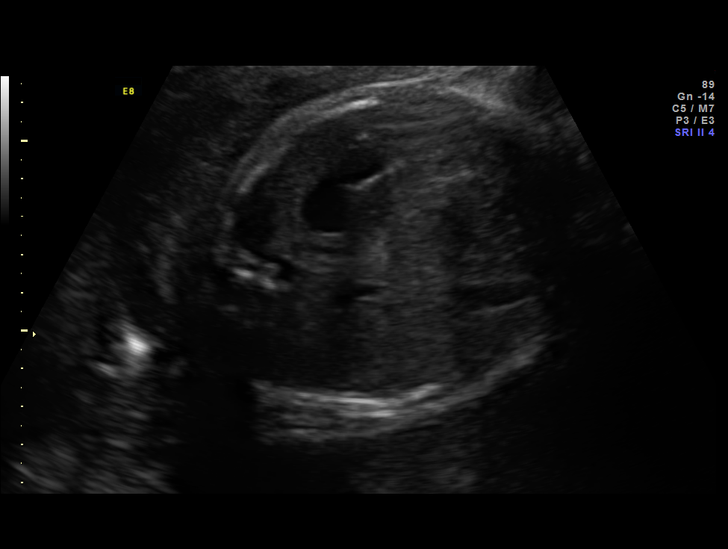
[im 18/35]
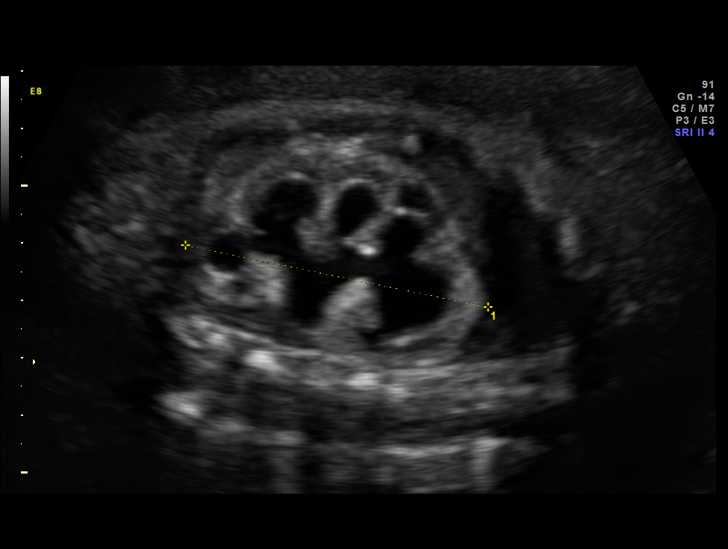
[im 21/35]
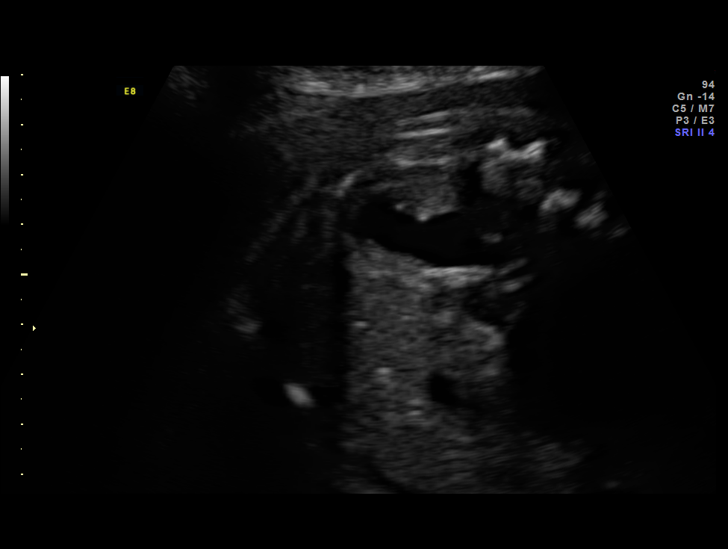
[im 23/35]
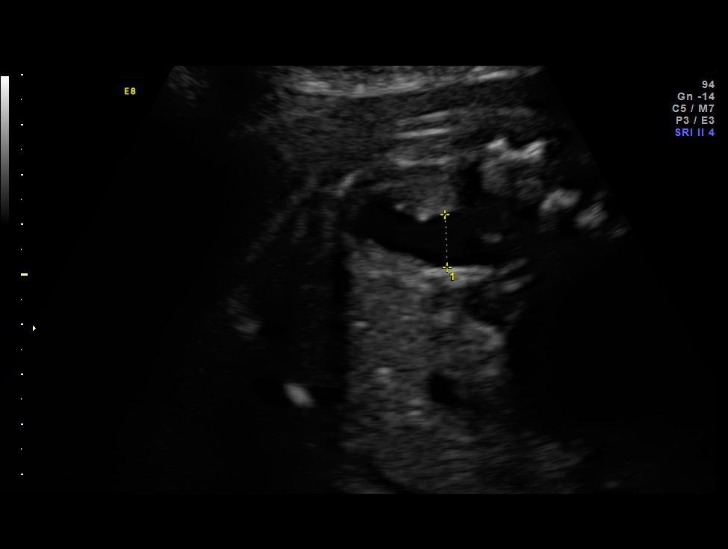
[im 26/35]
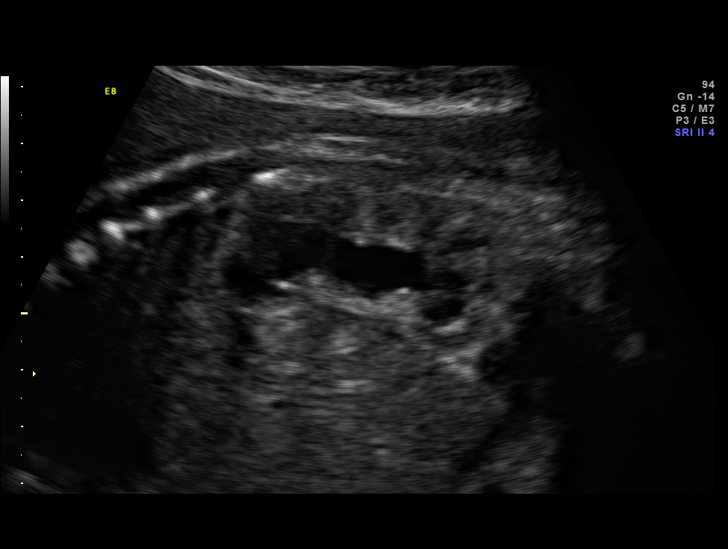
[im 28/35]
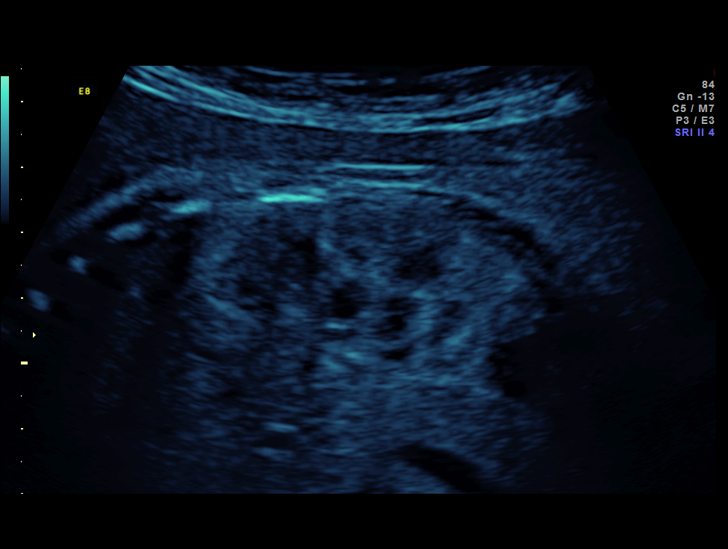
[im 31/35]
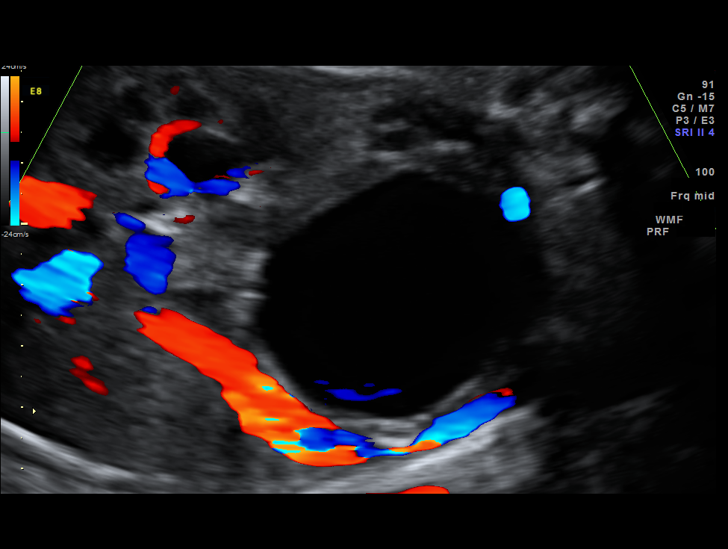
[im 33/35]
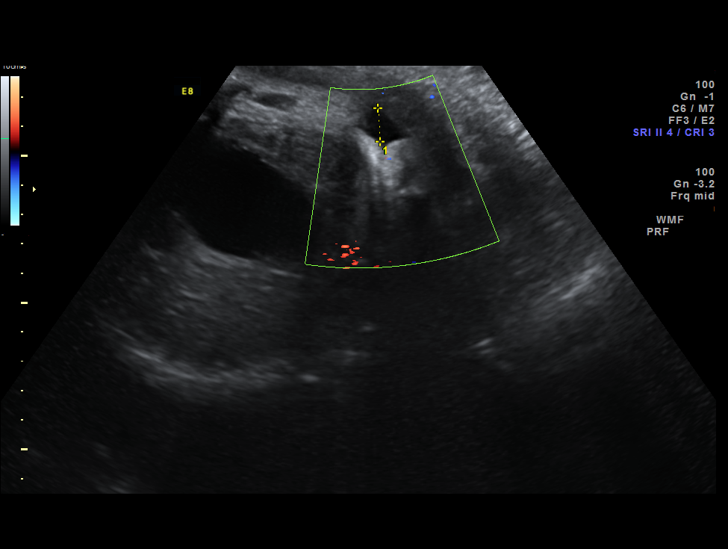

[13 of 28 positions shown; findings below may reference images not displayed]

OBSTETRICS REPORT
(Signed Final 12/11/2014 [DATE])

Date:

Faculty Physician
Service(s) Provided

US MFM OB LIMITED                                      76815.01
Indications

Gestational diabetes in pregnancy, diet controlled
Fetal abnormality - other known: bladder outlet
obstruction; low risk NIPS
33 weeks gestation of pregnancy
Fetal Evaluation

Num Of             1
Fetuses:
Fetal Heart        131                          bpm
Rate:
Cardiac Activity:  Observed
Presentation:      Cephalic
Placenta:          Posterior, above cervical
os
P. Cord            Previously Visualized
Insertion:

Amniotic Fluid
AFI FV:      Oligohydramnios
AFI Sum:     1        cm     < 3  %Tile     Larg Pckt:        1  cm
RUQ:   1       cm
Biophysical Evaluation

N.S.T:          Reactive
Gestational Age

LMP:           33w 1d        Date:  04/23/14                  EDD:   01/28/15
Best:          33w 1d    Det. By:   LMP  (04/23/14)           EDD:   01/28/15
Cervix Uterus Adnexa

Cervix:       Not visualized (advanced GA >68wks)
Impression

SIUP at 33+1 weeks
Bladder outlet obstruction
Cephalic presentation
Oligohydramnois; one pocket of 1 cm
NST reactive
Recommendations

Admit for observation of fetal heart tracing and course of
BMZ - discussed with Dr. Sorin

## 2016-06-06 IMAGING — US US MFM OB LIMITED
1 series · 13 of 28 positions shown · non-contrast
Comparison: none

[Series 1: us ob mfm follow up · 13 of 38 slices shown]
[im 2/38]
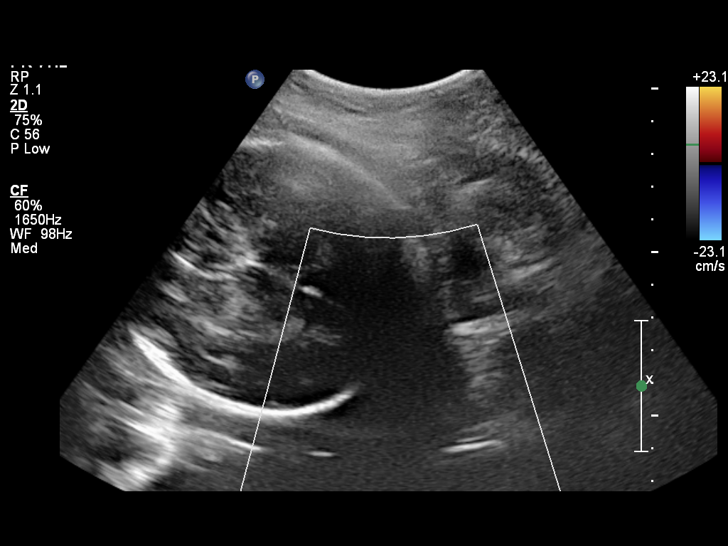
[im 5/38]
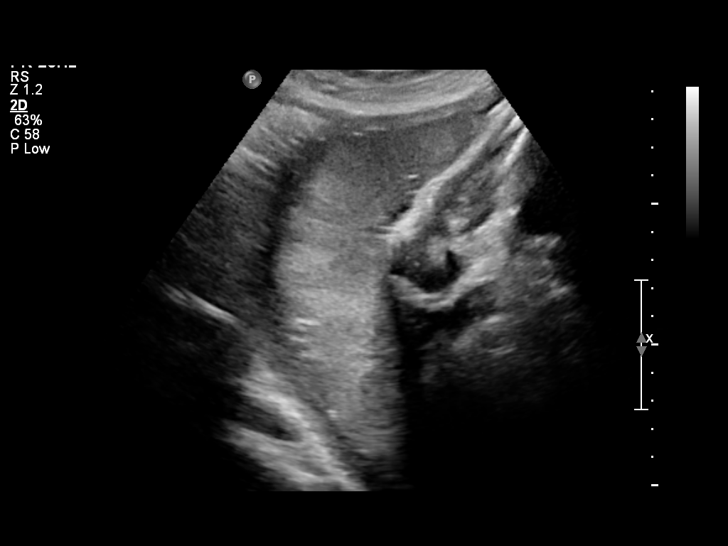
[im 7/38]
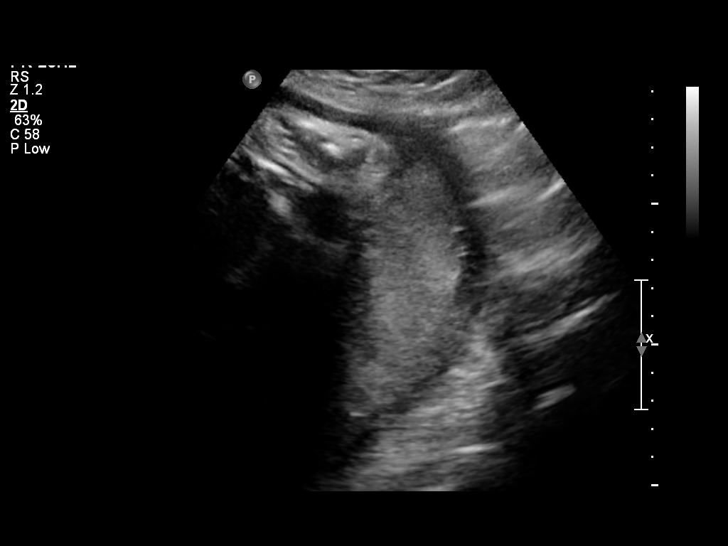
[im 10/38]
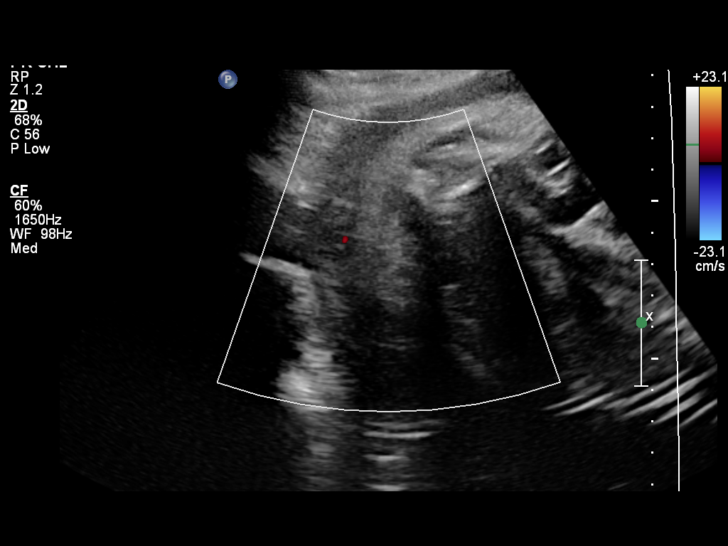
[im 13/38]
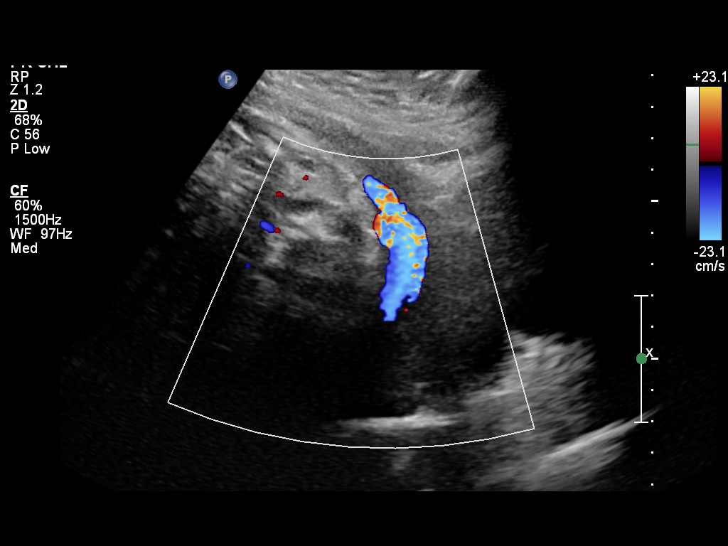
[im 16/38]
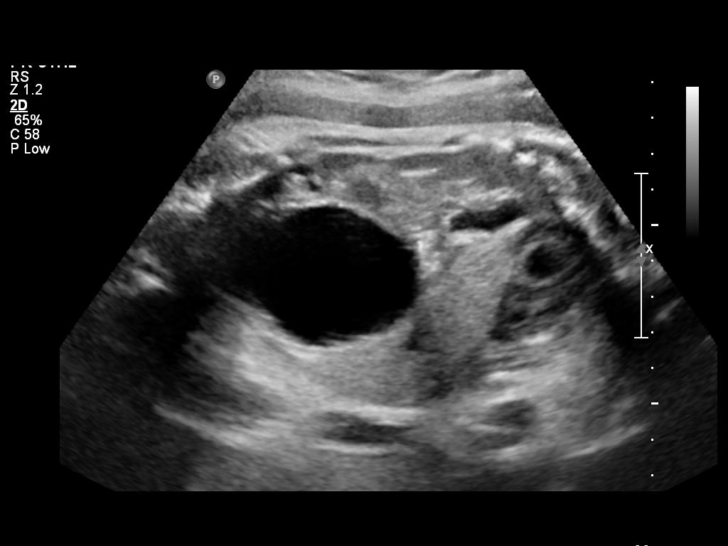
[im 20/38]
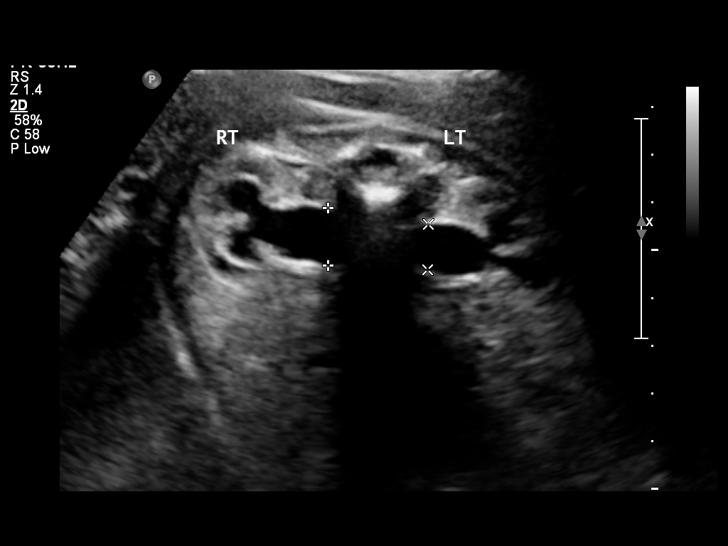
[im 22/38]
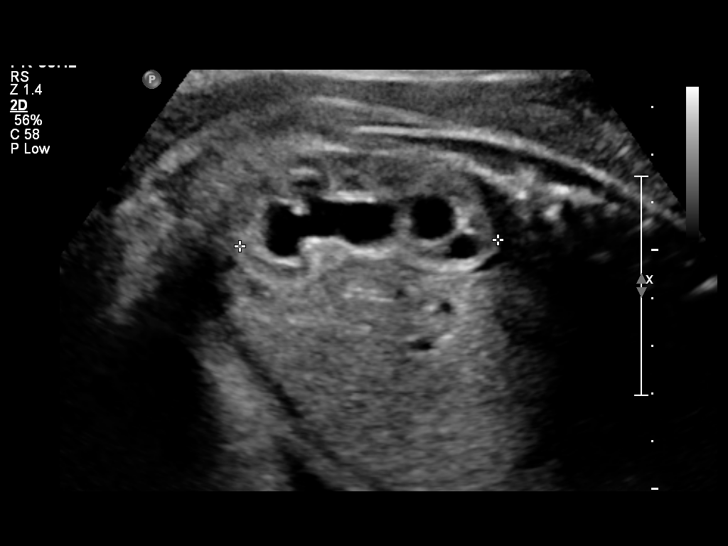
[im 25/38]
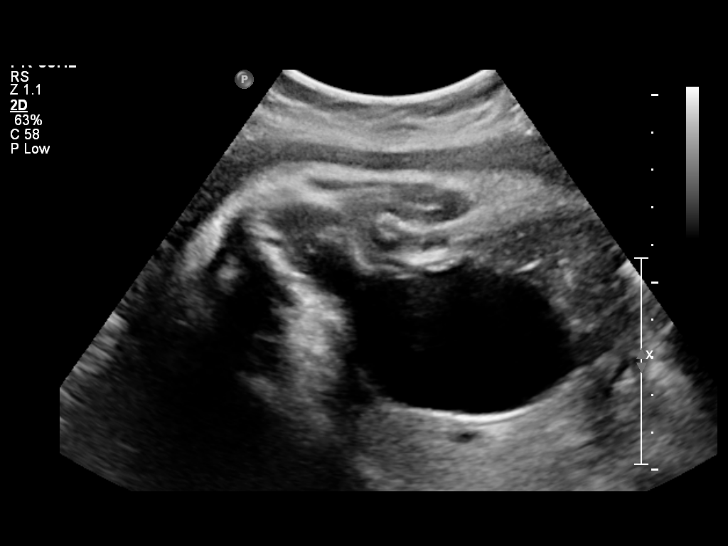
[im 28/38]
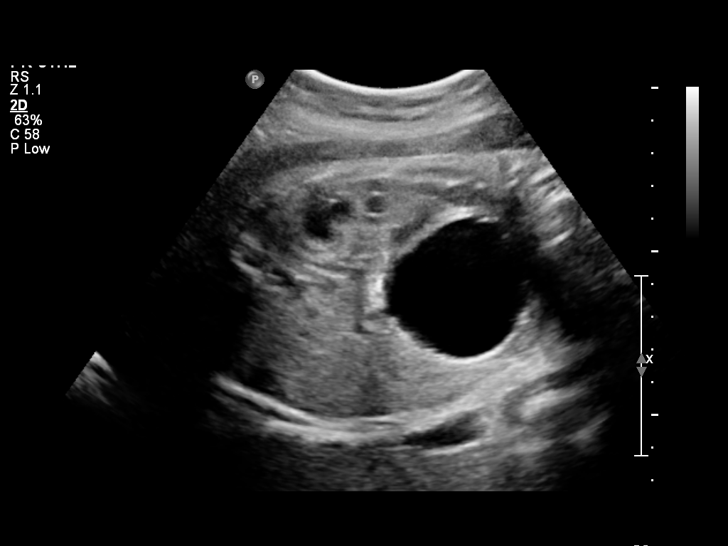
[im 31/38]
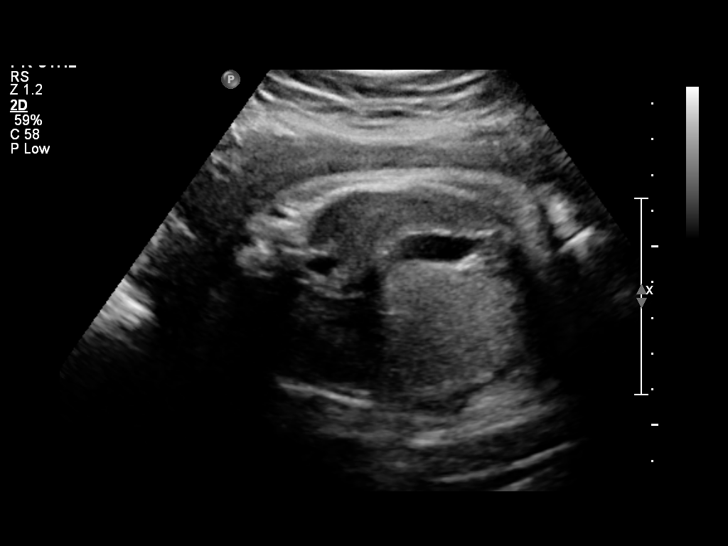
[im 33/38]
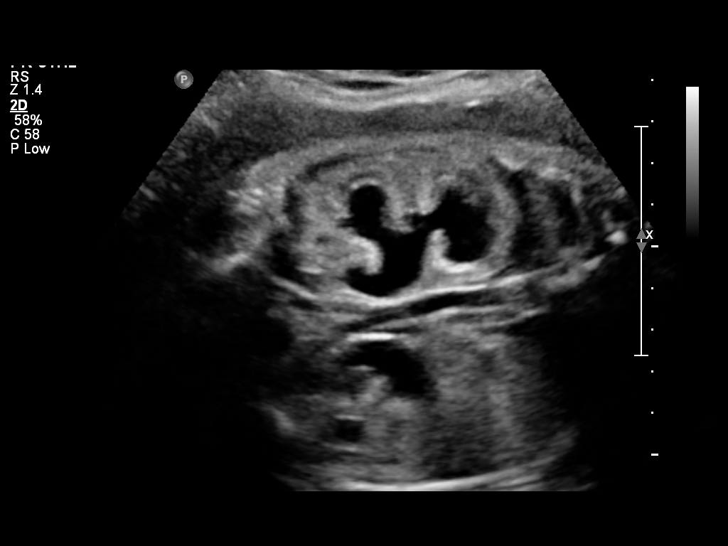
[im 36/38]
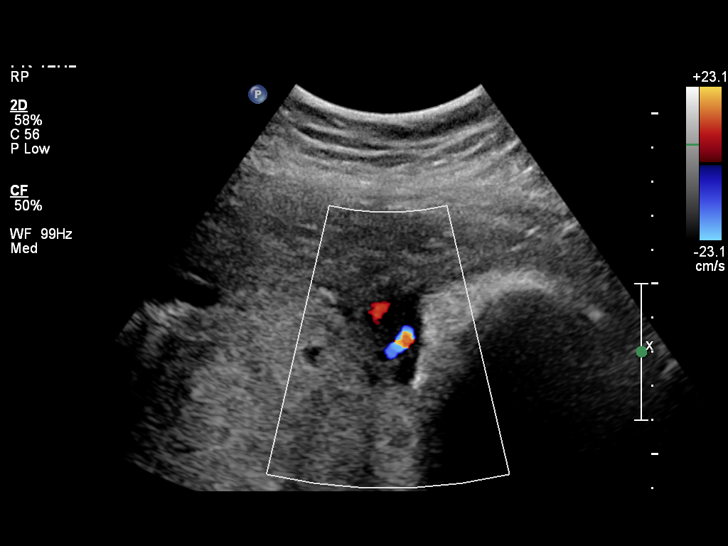

[13 of 28 positions shown; findings below may reference images not displayed]

OBSTETRICS REPORT
(Signed Final 12/14/2014 [DATE])

Service(s) Provided

US MFM OB LIMITED                                     76815.01
Indications

Gestational diabetes in pregnancy, diet controlled
Fetal abnormality - other known: bladder outlet
obstruction; low risk NIPS
33 weeks gestation of pregnancy
Uterine fibroids
Oligohydraminios, third trimester, unspecified
Fetal Evaluation

Num Of Fetuses:    1
Fetal Heart Rate:  134                          bpm
Cardiac Activity:  Observed
Presentation:      Cephalic
Placenta:          Posterior, above cervical
os
P. Cord            Previously Visualized
Insertion:

Amniotic Fluid
AFI FV:      Oligohydramnios
Gestational Age

LMP:           33w 3d        Date:  04/23/14                 EDD:    01/28/15
Best:          33w 3d     Det. By:  LMP  (04/23/14)          EDD:    01/28/15
Anatomy

Stomach:          Appears normal, left   Bladder:           Abnormal, see
sided
comments
Kidneys:          Abnormal, see
comments
Cervix Uterus Adnexa

Cervix:       Not visualized (advanced GA >39wks)
Impression

SIUP at 33+3 weeks
Severe oligohydramnios secondary to bladder outlet
obsstruction (PUVs)
Recommendations

My thoughts - continue to monitor intermittently and plan for a
34 week delivery
Plan to discuss her case at our [REDACTED] afternoon meeting -
will contact Attending on call later today

## 2017-06-25 ENCOUNTER — Encounter: Payer: Self-pay | Admitting: *Deleted

## 2018-02-26 ENCOUNTER — Ambulatory Visit (INDEPENDENT_AMBULATORY_CARE_PROVIDER_SITE_OTHER): Payer: Medicaid Other | Admitting: Family Medicine

## 2018-02-26 VITALS — BP 111/67 | HR 68 | Wt 167.2 lb

## 2018-02-26 DIAGNOSIS — Z3046 Encounter for surveillance of implantable subdermal contraceptive: Secondary | ICD-10-CM | POA: Diagnosis present

## 2018-02-26 LAB — POCT PREGNANCY, URINE: Preg Test, Ur: NEGATIVE

## 2018-02-26 MED ORDER — NORGESTIMATE-ETH ESTRADIOL 0.25-35 MG-MCG PO TABS
1.0000 | ORAL_TABLET | Freq: Every day | ORAL | 11 refills | Status: AC
Start: 2018-02-26 — End: ?

## 2018-02-26 NOTE — Progress Notes (Signed)
   Subjective:    Carolyn Caldwell - 37 y.o. female MRN [id number]  Date of birth: [birth year]  HPI  Carolyn Caldwell is a 37 y.o. [phone] female here for nexplanon removal. States that it is expired. Wants to try birth control pills instead.  Concerned about weight gain from nexplanon. Does not want to be pregnant.   OB History    Gravida  5   Para  3   Term  2   Preterm  1   AB  2   Living  2     SAB  2   TAB      Ectopic      Multiple  0   Live Births  2             Health Maintenance:  Normal pap on 07/16/2014.   Health Maintenance:   Health Maintenance Due  Topic Date Due  . PNEUMOCOCCAL POLYSACCHARIDE VACCINE AGE 90-64 HIGH RISK  04/04/1983  . FOOT EXAM  04/04/1991  . OPHTHALMOLOGY EXAM  04/04/1991  . URINE MICROALBUMIN  04/04/1991  . HEMOGLOBIN A1C  02/16/2015  . PAP SMEAR  07/15/2017  . INFLUENZA VACCINE  11/22/2017    -  reports that she has never smoked. She has never used smokeless tobacco. - Review of Systems: Per HPI. - Past Medical History: Patient Active Problem List   Diagnosis Date Noted  . Contraceptive management 02/08/2015   - Medications: reviewed and updated   Objective:   Physical Exam BP 111/67   Pulse 68   Wt 167 lb 3.2 oz (75.8 kg)   LMP 02/05/2018 (Approximate)   BMI 33.77 kg/m  Gen: NAD, alert, cooperative with exam, well-appearing HEENT: NCAT, PERRL, clear conjunctiva CV: RRR Resp: Non-labored breathing  Skin: no rashes, normal turgor  Neuro: no gross deficits.  Psych: good insight, alert and oriented    Assessment & Plan:   1. Encounter for Nexplanon removal Return if symptoms worsen or fail to improve.  Nexplanon Removal Procedure Note  Patient given informed consent for removal of her Nexplanon due to expired. Time out was performed and signed copy scanning into the chart. Nexplanon site identified on left arm. Area prepped in usual sterile fashon. One cc of 1% lidocaine was used to anesthetize the area  at the distal end of the implant. A small stab incision was made w/ an 11 blade scalpel at the distal portion end. The Nexplanon rod was grasped using hemostats and removed without difficulty. Removal of entire 40 mm rod confirmed and shown to patient. There was less than 3 cc blood loss. There were no complications. A pressure bandage was applied to reduce any bruising. The patient tolerated the procedure well and was given post procedure instructions. Sprintec Rx sent to pharmacy.    Routine preventative health maintenance measures emphasized. Please refer to After Visit Summary for other counseling recommendations.   Gwenevere Abbot, MD  OB Fellow  02/26/2018 7:26 PM

## 2018-02-27 ENCOUNTER — Encounter: Payer: Self-pay | Admitting: *Deleted

## 2019-03-05 ENCOUNTER — Other Ambulatory Visit: Payer: Self-pay

## 2019-03-05 DIAGNOSIS — Z20822 Contact with and (suspected) exposure to covid-19: Secondary | ICD-10-CM

## 2019-03-09 LAB — NOVEL CORONAVIRUS, NAA: SARS-CoV-2, NAA: DETECTED — AB

## 2019-03-17 ENCOUNTER — Telehealth: Payer: Self-pay

## 2019-03-17 NOTE — Telephone Encounter (Signed)
Pt's husband called to inform NT that pt received a call that stated she was cleared to go back to work. Pt received phone call from [phone]. Called number and LM that pt cannot go back to work. Pt gave verbal permission for VM and MyChart message.

## 2019-03-27 ENCOUNTER — Other Ambulatory Visit: Payer: Self-pay

## 2019-03-27 DIAGNOSIS — Z20822 Contact with and (suspected) exposure to covid-19: Secondary | ICD-10-CM

## 2019-03-30 LAB — NOVEL CORONAVIRUS, NAA: SARS-CoV-2, NAA: NOT DETECTED

## 2023-06-26 ENCOUNTER — Other Ambulatory Visit: Payer: Self-pay | Admitting: Family Medicine

## 2023-06-26 ENCOUNTER — Ambulatory Visit: Payer: Self-pay

## 2023-06-26 DIAGNOSIS — M25511 Pain in right shoulder: Secondary | ICD-10-CM

## 2023-11-26 ENCOUNTER — Telehealth: Payer: Self-pay

## 2023-11-26 NOTE — Telephone Encounter (Signed)
 Telephoned patient at [phone], number provided on scholarship application. Left a voice message with [employer] (scholarship) scheduling contact information.

## 2024-01-30 ENCOUNTER — Other Ambulatory Visit: Payer: Self-pay

## 2024-01-30 DIAGNOSIS — Z1231 Encounter for screening mammogram for malignant neoplasm of breast: Secondary | ICD-10-CM

## 2024-02-01 ENCOUNTER — Ambulatory Visit
Admission: RE | Admit: 2024-02-01 | Discharge: 2024-02-01 | Disposition: A | Discharge status: Home / Self Care | Source: Ambulatory Visit

## 2024-02-01 DIAGNOSIS — Z1231 Encounter for screening mammogram for malignant neoplasm of breast: Secondary | ICD-10-CM
